# Patient Record
Sex: Female | Born: 1935 | Race: White | Hispanic: No | Smoking: Never smoker
Health system: Southern US, Community
[De-identification: ages and names within clinical notes are randomized; demographics above are authoritative.]

## PROBLEM LIST (undated history)

## (undated) DIAGNOSIS — M1990 Unspecified osteoarthritis, unspecified site: Secondary | ICD-10-CM

## (undated) DIAGNOSIS — I4891 Unspecified atrial fibrillation: Principal | ICD-10-CM

## (undated) DIAGNOSIS — G629 Polyneuropathy, unspecified: Secondary | ICD-10-CM

## (undated) DIAGNOSIS — F3110 Bipolar disorder, current episode manic without psychotic features, unspecified: Secondary | ICD-10-CM

## (undated) DIAGNOSIS — D649 Anemia, unspecified: Secondary | ICD-10-CM

## (undated) DIAGNOSIS — F0280 Dementia in other diseases classified elsewhere without behavioral disturbance: Secondary | ICD-10-CM

## (undated) DIAGNOSIS — K219 Gastro-esophageal reflux disease without esophagitis: Secondary | ICD-10-CM

## (undated) DIAGNOSIS — Z87440 Personal history of urinary (tract) infections: Secondary | ICD-10-CM

## (undated) DIAGNOSIS — C4491 Basal cell carcinoma of skin, unspecified: Secondary | ICD-10-CM

## (undated) DIAGNOSIS — G309 Alzheimer's disease, unspecified: Secondary | ICD-10-CM

## (undated) HISTORY — DX: Alzheimer's disease, unspecified: G30.9

## (undated) HISTORY — DX: Gastro-esophageal reflux disease without esophagitis: K21.9

## (undated) HISTORY — DX: Personal history of urinary (tract) infections: Z87.440

## (undated) HISTORY — PX: LUMBAR LAMINECTOMY: SHX95

## (undated) HISTORY — PX: CHOLECYSTECTOMY: SHX55

## (undated) HISTORY — PX: OTHER SURGICAL HISTORY: SHX169

## (undated) HISTORY — DX: Anemia, unspecified: D64.9

## (undated) HISTORY — DX: Polyneuropathy, unspecified: G62.9

## (undated) HISTORY — DX: Unspecified atrial fibrillation: I48.91

## (undated) HISTORY — DX: Bipolar disorder, current episode manic without psychotic features, unspecified: F31.10

## (undated) HISTORY — DX: Dementia in other diseases classified elsewhere, unspecified severity, without behavioral disturbance, psychotic disturbance, mood disturbance, and anxiety: F02.80

## (undated) HISTORY — DX: Basal cell carcinoma of skin, unspecified: C44.91

## (undated) HISTORY — DX: Unspecified osteoarthritis, unspecified site: M19.90

---

## 2007-09-02 ENCOUNTER — Ambulatory Visit: Payer: Self-pay | Admitting: Internal Medicine

## 2007-09-02 HISTORY — PX: OTHER SURGICAL HISTORY: SHX169

## 2007-09-14 ENCOUNTER — Ambulatory Visit: Payer: Self-pay | Admitting: Internal Medicine

## 2007-09-16 ENCOUNTER — Observation Stay: Payer: Self-pay | Admitting: Internal Medicine

## 2007-10-03 ENCOUNTER — Ambulatory Visit: Payer: Self-pay | Admitting: Internal Medicine

## 2007-11-02 ENCOUNTER — Ambulatory Visit: Payer: Self-pay | Admitting: Internal Medicine

## 2007-12-03 ENCOUNTER — Ambulatory Visit: Payer: Self-pay | Admitting: Internal Medicine

## 2008-01-02 ENCOUNTER — Ambulatory Visit: Payer: Self-pay | Admitting: Internal Medicine

## 2008-02-02 ENCOUNTER — Ambulatory Visit: Payer: Self-pay | Admitting: Internal Medicine

## 2008-02-06 ENCOUNTER — Encounter: Admission: RE | Admit: 2008-02-06 | Discharge: 2008-02-06 | Payer: Self-pay | Admitting: Neurology

## 2008-02-21 ENCOUNTER — Encounter: Payer: Self-pay | Admitting: Internal Medicine

## 2008-02-22 ENCOUNTER — Encounter: Payer: Self-pay | Admitting: Internal Medicine

## 2008-02-27 ENCOUNTER — Encounter: Payer: Self-pay | Admitting: Internal Medicine

## 2008-02-27 ENCOUNTER — Ambulatory Visit: Payer: Self-pay | Admitting: Internal Medicine

## 2008-02-27 DIAGNOSIS — D509 Iron deficiency anemia, unspecified: Secondary | ICD-10-CM

## 2008-02-27 DIAGNOSIS — G309 Alzheimer's disease, unspecified: Secondary | ICD-10-CM

## 2008-02-27 DIAGNOSIS — F0280 Dementia in other diseases classified elsewhere without behavioral disturbance: Secondary | ICD-10-CM | POA: Insufficient documentation

## 2008-02-27 DIAGNOSIS — G589 Mononeuropathy, unspecified: Secondary | ICD-10-CM | POA: Insufficient documentation

## 2008-02-27 DIAGNOSIS — F319 Bipolar disorder, unspecified: Secondary | ICD-10-CM

## 2008-03-04 ENCOUNTER — Ambulatory Visit: Payer: Self-pay | Admitting: Internal Medicine

## 2008-03-21 ENCOUNTER — Telehealth: Payer: Self-pay | Admitting: Internal Medicine

## 2008-04-01 ENCOUNTER — Ambulatory Visit: Payer: Self-pay | Admitting: Internal Medicine

## 2008-04-19 ENCOUNTER — Telehealth: Payer: Self-pay | Admitting: Internal Medicine

## 2008-04-24 ENCOUNTER — Telehealth: Payer: Self-pay | Admitting: Internal Medicine

## 2008-04-25 ENCOUNTER — Telehealth: Payer: Self-pay | Admitting: Internal Medicine

## 2008-04-25 ENCOUNTER — Encounter: Payer: Self-pay | Admitting: Internal Medicine

## 2008-05-02 ENCOUNTER — Telehealth: Payer: Self-pay | Admitting: Internal Medicine

## 2008-05-02 ENCOUNTER — Ambulatory Visit: Payer: Self-pay | Admitting: Internal Medicine

## 2008-05-10 ENCOUNTER — Telehealth: Payer: Self-pay | Admitting: Internal Medicine

## 2008-05-16 ENCOUNTER — Encounter: Payer: Self-pay | Admitting: Internal Medicine

## 2008-05-23 ENCOUNTER — Ambulatory Visit: Payer: Self-pay | Admitting: Internal Medicine

## 2008-05-23 ENCOUNTER — Encounter: Payer: Self-pay | Admitting: Internal Medicine

## 2008-05-23 DIAGNOSIS — M1990 Unspecified osteoarthritis, unspecified site: Secondary | ICD-10-CM | POA: Insufficient documentation

## 2008-06-12 ENCOUNTER — Encounter: Payer: Self-pay | Admitting: Internal Medicine

## 2008-07-06 ENCOUNTER — Emergency Department: Payer: Self-pay | Admitting: Emergency Medicine

## 2008-07-06 ENCOUNTER — Telehealth: Payer: Self-pay | Admitting: Internal Medicine

## 2008-07-06 ENCOUNTER — Encounter: Payer: Self-pay | Admitting: Internal Medicine

## 2008-07-08 ENCOUNTER — Telehealth: Payer: Self-pay | Admitting: Internal Medicine

## 2008-07-17 ENCOUNTER — Telehealth: Payer: Self-pay | Admitting: Internal Medicine

## 2008-07-25 ENCOUNTER — Encounter: Payer: Self-pay | Admitting: Internal Medicine

## 2008-07-25 ENCOUNTER — Ambulatory Visit: Payer: Self-pay | Admitting: Internal Medicine

## 2008-08-16 ENCOUNTER — Encounter: Payer: Self-pay | Admitting: Internal Medicine

## 2008-08-21 ENCOUNTER — Encounter: Payer: Self-pay | Admitting: Internal Medicine

## 2008-08-26 ENCOUNTER — Telehealth: Payer: Self-pay | Admitting: Internal Medicine

## 2008-08-28 ENCOUNTER — Telehealth: Payer: Self-pay | Admitting: Internal Medicine

## 2008-09-10 ENCOUNTER — Telehealth: Payer: Self-pay | Admitting: Internal Medicine

## 2008-09-11 ENCOUNTER — Encounter: Payer: Self-pay | Admitting: Internal Medicine

## 2008-09-19 ENCOUNTER — Encounter: Payer: Self-pay | Admitting: Internal Medicine

## 2008-10-17 ENCOUNTER — Telehealth: Payer: Self-pay | Admitting: Internal Medicine

## 2008-10-22 ENCOUNTER — Encounter: Payer: Self-pay | Admitting: Internal Medicine

## 2008-11-21 ENCOUNTER — Encounter: Payer: Self-pay | Admitting: Internal Medicine

## 2008-11-21 ENCOUNTER — Ambulatory Visit: Payer: Self-pay | Admitting: Internal Medicine

## 2008-12-10 ENCOUNTER — Encounter: Payer: Self-pay | Admitting: Internal Medicine

## 2008-12-18 ENCOUNTER — Encounter: Payer: Self-pay | Admitting: Internal Medicine

## 2008-12-18 ENCOUNTER — Telehealth: Payer: Self-pay | Admitting: Internal Medicine

## 2009-01-22 ENCOUNTER — Encounter: Payer: Self-pay | Admitting: Internal Medicine

## 2009-02-24 ENCOUNTER — Telehealth: Payer: Self-pay | Admitting: Internal Medicine

## 2009-03-17 ENCOUNTER — Telehealth: Payer: Self-pay | Admitting: Internal Medicine

## 2009-03-19 ENCOUNTER — Encounter: Payer: Self-pay | Admitting: Internal Medicine

## 2009-04-08 ENCOUNTER — Telehealth: Payer: Self-pay | Admitting: Internal Medicine

## 2009-04-10 ENCOUNTER — Encounter: Payer: Self-pay | Admitting: Internal Medicine

## 2009-04-23 ENCOUNTER — Encounter: Payer: Self-pay | Admitting: Internal Medicine

## 2009-05-01 ENCOUNTER — Telehealth: Payer: Self-pay | Admitting: Internal Medicine

## 2009-05-08 ENCOUNTER — Encounter: Payer: Self-pay | Admitting: Internal Medicine

## 2009-05-08 ENCOUNTER — Ambulatory Visit: Payer: Self-pay | Admitting: Internal Medicine

## 2009-05-30 ENCOUNTER — Telehealth: Payer: Self-pay | Admitting: Internal Medicine

## 2009-05-30 ENCOUNTER — Emergency Department: Payer: Self-pay | Admitting: Emergency Medicine

## 2009-07-03 ENCOUNTER — Encounter: Payer: Self-pay | Admitting: Family Medicine

## 2009-08-14 ENCOUNTER — Encounter: Payer: Self-pay | Admitting: Family Medicine

## 2009-08-14 ENCOUNTER — Telehealth: Payer: Self-pay | Admitting: Family Medicine

## 2009-08-14 ENCOUNTER — Encounter: Payer: Self-pay | Admitting: Internal Medicine

## 2009-08-18 ENCOUNTER — Telehealth: Payer: Self-pay | Admitting: Internal Medicine

## 2009-08-22 ENCOUNTER — Ambulatory Visit: Payer: Self-pay | Admitting: Internal Medicine

## 2009-08-22 DIAGNOSIS — G20 Parkinson's disease: Secondary | ICD-10-CM

## 2009-08-23 ENCOUNTER — Encounter: Payer: Self-pay | Admitting: Internal Medicine

## 2009-08-23 LAB — CONVERTED CEMR LAB: Lithium Lvl: 0.9 meq/L (ref 0.80–1.40)

## 2009-08-25 LAB — CONVERTED CEMR LAB
AST: 18 units/L (ref 0–37)
Albumin: 3.9 g/dL (ref 3.5–5.2)
BUN: 19 mg/dL (ref 6–23)
Basophils Absolute: 0 10*3/uL (ref 0.0–0.1)
CO2: 28 meq/L (ref 19–32)
Calcium: 9.8 mg/dL (ref 8.4–10.5)
Chloride: 109 meq/L (ref 96–112)
Creatinine, Ser: 1.2 mg/dL (ref 0.4–1.2)
Eosinophils Absolute: 0.2 10*3/uL (ref 0.0–0.7)
HCT: 31.1 % — ABNORMAL LOW (ref 36.0–46.0)
Hemoglobin: 10.6 g/dL — ABNORMAL LOW (ref 12.0–15.0)
Lymphs Abs: 0.7 10*3/uL (ref 0.7–4.0)
MCHC: 34.2 g/dL (ref 30.0–36.0)
MCV: 94.4 fL (ref 78.0–100.0)
Monocytes Absolute: 0.4 10*3/uL (ref 0.1–1.0)
Monocytes Relative: 6.4 % (ref 3.0–12.0)
Neutro Abs: 4.6 10*3/uL (ref 1.4–7.7)
Platelets: 159 10*3/uL (ref 150.0–400.0)
RDW: 13.9 % (ref 11.5–14.6)
TSH: 0.94 microintl units/mL (ref 0.35–5.50)
Total Bilirubin: 0.5 mg/dL (ref 0.3–1.2)

## 2009-09-01 ENCOUNTER — Encounter: Payer: Self-pay | Admitting: Family Medicine

## 2009-09-02 ENCOUNTER — Ambulatory Visit: Payer: Self-pay | Admitting: Family Medicine

## 2009-09-08 ENCOUNTER — Encounter: Payer: Self-pay | Admitting: Internal Medicine

## 2009-10-03 ENCOUNTER — Encounter: Payer: Self-pay | Admitting: Internal Medicine

## 2009-11-06 ENCOUNTER — Encounter: Payer: Self-pay | Admitting: Internal Medicine

## 2009-11-06 ENCOUNTER — Ambulatory Visit: Payer: Self-pay | Admitting: Internal Medicine

## 2010-02-23 ENCOUNTER — Telehealth: Payer: Self-pay | Admitting: Internal Medicine

## 2010-02-23 ENCOUNTER — Encounter: Payer: Self-pay | Admitting: Internal Medicine

## 2010-02-25 ENCOUNTER — Encounter: Payer: Self-pay | Admitting: Internal Medicine

## 2010-02-26 ENCOUNTER — Encounter: Payer: Self-pay | Admitting: Internal Medicine

## 2010-02-27 ENCOUNTER — Encounter: Payer: Self-pay | Admitting: Internal Medicine

## 2010-03-03 NOTE — Progress Notes (Signed)
Summary: galantamine  Phone Note Refill Request Message from:  Fax from Pharmacy on February 24, 2009 2:19 PM  Refills Requested: Medication #1:  GALANTAMINE HYDROBROMIDE 8 MG XR24H-CAP 1 daily.   Supply Requested: 3 months pharmacare services [phone] fax [phone]   Method Requested: Fax to Local Pharmacy Initial call taken by: Benny Lennert CMA Duncan Dull),  February 24, 2009 2:20 PM  Follow-up for Phone Call        okay to refill for 1 year Follow-up by: Cindee Salt MD,  February 24, 2009 3:31 PM  Additional Follow-up for Phone Call Additional follow up Details #1::        Rx faxed to pharmacy Additional Follow-up by: DeShannon Smith CMA Duncan Dull),  February 24, 2009 3:52 PM    Prescriptions: GALANTAMINE HYDROBROMIDE 8 MG XR24H-CAP (GALANTAMINE HYDROBROMIDE) 1 daily  #30 x 12   Entered by:   Mervin Hack CMA (AAMA)   Authorized by:   Cindee Salt MD   Signed by:   Mervin Hack CMA (AAMA) on 02/24/2009   Method used:   Faxed to ...       [facility] (mail-order)       [address]       [place], [place]  [address]       Ph: [phone]       Fax: [phone]   RxID:   [id number]

## 2010-03-03 NOTE — Assessment & Plan Note (Signed)
Summary: [facility]   Vital Signs:  Patient profile:   77 year old female Weight:      135 pounds Pulse rate:   60 / minute Resp:     18 per minute BP sitting:   142 / 50 CC: Assisted living follow up   History of Present Illness: Seen with daughter Michele Mendoza Reviewed status with Michele Mendoza the clinical care coordinator  No new concerns from the staff  has been on axona for about 2 weeks daughter had been concerned about some arm and hand bruising They have healed now and no recurrence Initially concern about the axona--but doesn't seem to be related Daughter feels she may be slightly better--staff not aware of any cognitive changes (she recognized daughter's car, remembered that daughter had some arm problems)  Now on locked unit upstairs she has adjusted well gentleman friend died---she doesn't realize this and doesn't seem to miss him  Hair loss has stopped Now doing better with this  Mood has been good No apparent depression  NO dysuria or change in urinary habits  Allergies: 1)  ! Sulfamethoxazole-Tmp Ds (Sulfamethoxazole-Trimethoprim) 2)  ! Codeine Sulfate (Codeine Sulfate) 3)  Depakote (Divalproex Sodium) 4)  Penicillin V Potassium (Penicillin V Potassium) 5)  Ciprofloxacin Hcl (Ciprofloxacin Hcl) 6)  Epinephrine Hcl (Epinephrine Hcl) 7)  Prednisone (Prednisone)  Past History:  Past medical, surgical, family and social histories (including risk factors) reviewed for relevance to current acute and chronic problems.  Past Medical History: Reviewed history from 05/23/2008 and no changes required. Anemia-iron deficiency Altzheimers Bipolar disorder---mania Neuropathy Basal cell carcinomas Recurrent UTIs Osteoarthritis  Past Surgical History: Reviewed history from 02/27/2008 and no changes required. Anemia-8/09    Transfused, procrit once----------------Dr Gittin Hospitalized twice for mania Right leg plate from  ZOXWRUEA--5409'W Cholecystectomy--1990's Lumbar laminectomy--1960's  Family History: Reviewed history from 02/27/2008 and no changes required. Daughter with CAD Mom died of Altzheimers--died in late 1's Dad died of possible stomach cancer 10 sibs  Social History: Reviewed history from 02/27/2008 and no changes required. Widowed June 2009 3 daughters, 1 son Retired---[employer] Never Smoked Alcohol use-no  Has living will. Michele Mendoza and [place] hold health care POA Discussed DNR--Michele Mendoza feels she would want this  Review of Systems       sleeps well occ dry mouth appetite is fine weight fairly stable Recent ENT eval---   L>R hearing loss. Considering amplification  Physical Exam  General:  alert and normal appearance.   Neck:  supple, no masses, no thyromegaly, no carotid bruits, and no cervical lymphadenopathy.   Lungs:  normal respiratory effort and normal breath sounds.   Heart:  normal rate, regular rhythm, no murmur, and no gallop.   Abdomen:  soft and non-tender.   Msk:  no joint tenderness and no joint swelling.   Extremities:  no edema Neurologic:  alert  Fairly quiet but responds appropriately to questions Normal tone no focal weakness Psych:  not anxious appearing and not depressed appearing.     Impression & Recommendations:  Problem # 1:  ALZHEIMERS DISEASE (ICD-331.0) Assessment Unchanged may be slightly better cognitively on the axona--though hard to tell will continue the galantamine and namenda  as well  Problem # 2:  BIPOLAR DISORDER UNSPECIFIED (ICD-296.80) Assessment: Unchanged stable on zyprexa failed wean in the past so no change  Problem # 3:  OSTEOARTHRITIS (ICD-715.90) Assessment: Unchanged no major pain issues of late  Problem # 4:  NEUROPATHY (ICD-355.9) Assessment: Comment Only no apparent pain or  sensory problems currently  Complete Medication List: 1)  Docusate Sodium 100 Mg Caps (Docusate sodium) .... Take 2 capsules by  mouth once daily for stool softener ( take with glass of water) 2)  Vitamin D 400 Unit Tabs (Cholecalciferol) .... Take 2 tablets by mouth every morning 3)  Namenda 10 Mg Tabs (Memantine hcl) .... Take 1 tablet by mouth two times a day at 8am and 6pm (alzheimers) 4)  Folic Acid 1 Mg Tabs (Folic acid) .... Take 1 tablet by mouth once daily at 2pm 5)  Lithium Carbonate 300 Mg Tabs (Lithium carbonate) .... Take 1 tablet by mouth every evening 6)  Tandem 162-115.2 Mg Caps (Ferrous fum-iron polysacch) .... Take 1 capsule by mouth once daily at 2:00pm 7)  Zyprexa 5 Mg Tabs (Olanzapine) .... Take 1/2 tablet by mouth two times a day 8)  Genteal Gel (Artificial tear) .... Apply 1 drop in each eye three times a day 9)  Galantamine Hydrobromide 8 Mg Xr24h-cap (Galantamine hydrobromide) .Marland Kitchen.. 1 daily 10)  Axona Pack (Dietary management product) .Marland Kitchen.. 1 pack daily as directed  Patient Instructions: 1)  Will plan follow up in about 4 months

## 2010-03-03 NOTE — Miscellaneous (Signed)
Summary: Care Plan/[facility]  Care Plan/[facility]   Imported By: Lanelle Bal 09/08/2009 08:44:54  _____________________________________________________________________  External Attachment:    Type:   Image     Comment:   External Document

## 2010-03-03 NOTE — Miscellaneous (Signed)
Summary: Standing & Med Orders/[facility]  Standing & Med Orders/[facility]   Imported By: Lanelle Bal 03/21/2009 12:35:13  _____________________________________________________________________  External Attachment:    Type:   Image     Comment:   External Document

## 2010-03-03 NOTE — Miscellaneous (Signed)
Summary: Care Plan/[facility]  Care Plan/[facility]   Imported By: Lanelle Bal 09/08/2009 08:08:54  _____________________________________________________________________  External Attachment:    Type:   Image     Comment:   External Document

## 2010-03-03 NOTE — Miscellaneous (Signed)
Summary: Ocie Bob PT & UA Orders/[facility] Gwenlyn Fudge [facility] & [facility] Orders/[facility]   Imported By: Lanelle Bal 08/19/2009 12:24:11  _____________________________________________________________________  External Attachment:    Type:   Image     Comment:   External Document

## 2010-03-03 NOTE — Miscellaneous (Signed)
Summary: Order for [employer]  Order for [facility]   Imported By: Lanelle Bal 10/13/2009 08:10:45  _____________________________________________________________________  External Attachment:    Type:   Image     Comment:   External Document

## 2010-03-03 NOTE — Miscellaneous (Signed)
Summary: Standing Orders & Care Plan/[facility]  Standing Orders & Care Plan/[facility]   Imported By: Lanelle Bal 09/29/2009 08:43:56  _____________________________________________________________________  External Attachment:    Type:   Image     Comment:   External Document

## 2010-03-03 NOTE — Miscellaneous (Signed)
Summary: Face to Face Encounter Form/[facility]  Face to Face Encounter Form/[facility]   Imported By: Lanelle Bal 08/27/2009 11:57:56  _____________________________________________________________________  External Attachment:    Type:   Image     Comment:   External Document

## 2010-03-03 NOTE — Miscellaneous (Signed)
Summary: UTI Sample [facility]  UTI Sample Order/[facility]   Imported By: Lanelle Bal 07/09/2009 09:03:25  _____________________________________________________________________  External Attachment:    Type:   Image     Comment:   External Document

## 2010-03-03 NOTE — Progress Notes (Signed)
Summary: uti  Phone Note Call from Patient Call back at [phone]   Caller: Nona Dell  Call For: Cindee Salt MD Complaint: Breathing Problems Summary of Call: Daugther says that patient is having right flank pain, having hard time standing, and feeling very weak. She says that her mother's urine smells horrible.  Diamantina Monks sent an order yesterday for a urinalysis  and they haven't heard anything back. Daughter is upset because she has been having these symptoms for 3 days or more and she gets utis very often and she wants it to be treated. She is asking if you could just go ahead and treat her for this.  Daughter also would like to request physical therapy evaluation because it is getting really hard for her to get around and daughter is worried that she will fall. Daughter also does not want her to take the axona anymore because she doesn't feel that it is doing anything.  Initial call taken by: Melody Comas,  August 14, 2009 10:08 AM  Follow-up for Phone Call        McLean, can you please look into this for me? Thanks. Ruthe Mannan MD  August 14, 2009 10:48 AM  found the order in Dr.Letvak's inbox, it was faxed on Tuesday. I sent a flag to asking no one to put or send anything to Dr.Letvak. Aram Beecham stated I should be checking his inbox, and I will now. I faxed order back over to [facility] and spoke with [name] and [name] and apoligized for now seeing the order. Did you want to give the order for physical therapy? Please advise. DeShannon Smith CMA Duncan Dull)  August 14, 2009 11:52 AM    Additional Follow-up for Phone Call Additional follow up Details #1::        yes, I can give the order for PT as well. Ruthe Mannan MD  August 14, 2009 11:55 AM  order faxed to [facility] for physical therapy. DeShannon Katrinka Blazing CMA Duncan Dull)  August 14, 2009 12:19 PM   spoke with daughter and advised that urine will be done and that order for PT has been faxed. Daughter requested that this be done STAT before the  weekend so that her mother will be treated. I then called [name] and she's getting a urine from the pt and take it over to [facility] for UA and they will fax results to Dr. Dayton Martes. DeShannon Smith CMA Duncan Dull)  August 14, 2009 12:40 PM     Additional Follow-up for Phone Call Additional follow up Details #2::    ok thank you. Ruthe Mannan MD  August 14, 2009 12:42 PM

## 2010-03-03 NOTE — Progress Notes (Signed)
Summary: Rx Zyprexa  Phone Note Refill Request Call back at [phone] Message from:  [facility] on March 17, 2009 1:25 PM  Refills Requested: Medication #1:  ZYPREXA 5 MG TABS take 1/2 tablet by mouth two times a day   Last Refilled: 02/11/2009 Received faxed refill request, form in your IN box.  Please advise   Method Requested: Fax to Local Pharmacy Initial call taken by: Linde Gillis CMA Duncan Dull),  March 17, 2009 1:26 PM  Follow-up for Phone Call        Rx completed in Dr. Tiajuana Amass Follow-up by: Cindee Salt MD,  March 17, 2009 1:28 PM    Prescriptions: ZYPREXA 5 MG TABS (OLANZAPINE) take 1/2 tablet by mouth two times a day  #30 x 11   Entered and Authorized by:   Cindee Salt MD   Signed by:   Cindee Salt MD on 03/17/2009   Method used:   Faxed to ...       [facility] (mail-order)       [address]       [place], [place]  [address]       Ph: [phone]       Fax: [phone]   RxID:   [id number]

## 2010-03-03 NOTE — Assessment & Plan Note (Signed)
Summary: 8:00  FILL OUT FORM FOR [facility]   Vital Signs:  Patient profile:   77 year old female Weight:      138.50 pounds Temp:     98.2 degrees F oral Pulse rate:   76 / minute Pulse rhythm:   regular BP sitting:   138 / 70  (left arm) Cuff size:   regular  Vitals Entered By: Janee Morn CMA (August 22, 2009 8:26 AM) CC: Fill out form   History of Present Illness: Has seen Dr Sandria Manly in past for her dementia and neuro syndrome last seen about 1 year ago CT head done without sig findings Peripheral neuropathy diagnosed  Has had 2 recent falls Unable to get up out of chair as well Had to call rescue 3 weeks ago due to bruising and swelling of foot after call Fall again 1 week ago Doesn't remember to use walker Legs get weak--but also lists off to side  Increased tremor--esp if cold SHoulders twitch--even when asleep  May have some degree of orthostasis daughter has checked  Allergies: 1)  ! Sulfamethoxazole-Tmp Ds (Sulfamethoxazole-Trimethoprim) 2)  ! Codeine Sulfate (Codeine Sulfate) 3)  Depakote (Divalproex Sodium) 4)  Penicillin V Potassium (Penicillin V Potassium) 5)  Ciprofloxacin Hcl (Ciprofloxacin Hcl) 6)  Epinephrine Hcl (Epinephrine Hcl) 7)  Prednisone (Prednisone)  Past History:  Past medical, surgical, family and social histories (including risk factors) reviewed for relevance to current acute and chronic problems.  Past Medical History: Reviewed history from 05/23/2008 and no changes required. Anemia-iron deficiency Altzheimers Bipolar disorder---mania Neuropathy Basal cell carcinomas Recurrent UTIs Osteoarthritis  Past Surgical History: Reviewed history from 02/27/2008 and no changes required. Anemia-8/09    Transfused, procrit once----------------Dr Gittin Hospitalized twice for mania Right leg plate from ZOXWRUEA--5409'W Cholecystectomy--1990's Lumbar laminectomy--1960's  Family History: Reviewed history from 02/27/2008 and no changes  required. Daughter with CAD Mom died of Altzheimers--died in late 59's Dad died of possible stomach cancer 10 sibs  Social History: Reviewed history from 02/27/2008 and no changes required. Widowed June 2009 3 daughters, 1 son Retired---[employer] Never Smoked Alcohol use-no  Has living will. [name] and [place] hold health care POA Discussed DNR--[name] feels she would want this  Review of Systems       appetite is down Stopped axona in case it is affecting her appetite ------ daughter feels she had "done well with it" sleeping okay--more recently  Physical Exam  General:  alert.  NAD Neck:  supple and no masses.   Neurologic:  No focal weakness moderate resting tremor some overshoot with finger to nose Early shuffling gait No increased tone but moderate bradykinesia Psych:  flat affect and subdued.   Does follow commands fairly well   Impression & Recommendations:  Problem # 1:  PARKINSONISM (ICD-332.0) Assessment New has had mild features of this in past but now much worse gait disturbance and falls seems to be part of her dementia syndrome  will proceed with PT May benefit from OT since her functional status has declined---dementia may preclude response though  Problem # 2:  BIPOLAR DISORDER UNSPECIFIED (ICD-296.80) Assessment: Comment Only stable affective status will check labs  Orders: Venipuncture (11914) TLB-Renal Function Panel (80069-RENAL) TLB-CBC Platelet - w/Differential (85025-CBCD) TLB-Hepatic/Liver Function Pnl (80076-HEPATIC) TLB-TSH (Thyroid Stimulating Hormone) (84443-TSH) T-Lithium Level ([address]-62130) Specimen Handling (86578)  Complete Medication List: 1)  Docusate Sodium 100 Mg Caps (Docusate sodium) .... Take 2 capsules by mouth once daily for stool softener ( take with glass of water) 2)  Vitamin D 400 Unit Tabs (Cholecalciferol) .... Take 2 tablets by mouth every morning 3)  Namenda 10 Mg Tabs (Memantine hcl) .... Take 1  tablet by mouth two times a day at 8am and 6pm (alzheimers) 4)  Folic Acid 1 Mg Tabs (Folic acid) .... Take 1 tablet by mouth once daily at 2pm 5)  Lithium Carbonate 300 Mg Tabs (Lithium carbonate) .... Take 1 tablet by mouth every evening 6)  Tandem 162-115.2 Mg Caps (Ferrous fum-iron polysacch) .... Take 1 capsule by mouth once daily at 2:00pm 7)  Zyprexa 5 Mg Tabs (Olanzapine) .... Take 1/2 tablet by mouth two times a day 8)  Genteal Gel (Artificial tear) .... Apply 1 drop in each eye three times a day 9)  Galantamine Hydrobromide 8 Mg Xr24h-cap (Galantamine hydrobromide) .Marland Kitchen.. 1 daily  Patient Instructions: 1)  Will do follow up at [facility]  Current Allergies (reviewed today): ! SULFAMETHOXAZOLE-TMP DS (SULFAMETHOXAZOLE-TRIMETHOPRIM) ! CODEINE SULFATE (CODEINE SULFATE) DEPAKOTE (DIVALPROEX SODIUM) PENICILLIN V POTASSIUM (PENICILLIN V POTASSIUM) CIPROFLOXACIN HCL (CIPROFLOXACIN HCL) EPINEPHRINE HCL (EPINEPHRINE HCL) PREDNISONE (PREDNISONE)  Appended Document: 8:00  FILL OUT FORM FOR [facility]    Clinical Lists Changes  Orders: Added new Service order of Specimen Handling ([address]109) - Signed

## 2010-03-03 NOTE — Assessment & Plan Note (Signed)
Summary[facility]   Vital Signs:  Patient profile:   77 year old female Weight:      141 pounds BMI:     22.84 Temp:     97.0 degrees F Pulse rate:   72 / minute Resp:     16 per minute BP sitting:   134 / 86 CC: Assisted living follow up   History of Present Illness: Daughter Michele Mendoza is here reveiwed status with Michele Mendoza--the clinical coordinator here as well  Now on the second floor --the secure area Has adjusted well Likes her room diaghter is satisfied  Still with some trouble walking Has rollator but generally won't use it No recent leaning spells  No recent falls---- a couple since my last visit Did require ER eval a couple of months ago but only had hip bruising Some trouble getting out of a chair now---does use lift chair  Mood has been fine No sig depression  No mania  Hard to judge neuropathy she doesn't complain of pain some balance issues  Has been less socially interactive lately--increasingly withdrawn Sleeps more during the day  Allergies: 1)  ! Sulfamethoxazole-Tmp Ds (Sulfamethoxazole-Trimethoprim) 2)  ! Codeine Sulfate (Codeine Sulfate) 3)  Depakote (Divalproex Sodium) 4)  Penicillin V Potassium (Penicillin V Potassium) 5)  Ciprofloxacin Hcl (Ciprofloxacin Hcl) 6)  Epinephrine Hcl (Epinephrine Hcl) 7)  Prednisone (Prednisone)  Past History:  Past medical, surgical, family and social histories (including risk factors) reviewed for relevance to current acute and chronic problems.  Past Medical History: Reviewed history from 05/23/2008 and no changes required. Anemia-iron deficiency Altzheimers Bipolar disorder---mania Neuropathy Basal cell carcinomas Recurrent UTIs Osteoarthritis  Past Surgical History: Reviewed history from 02/27/2008 and no changes required. Anemia-8/09    Transfused, procrit once----------------Dr Gittin Hospitalized twice for mania Right leg plate from  [id number]--9562'Z Cholecystectomy--1990's Lumbar laminectomy--1960's  Family History: Reviewed history from 02/27/2008 and no changes required. Daughter with CAD Mom died of Altzheimers--died in late 28's Dad died of possible stomach cancer 10 sibs  Social History: Reviewed history from 02/27/2008 and no changes required. Widowed June 2009 3 daughters, 1 son Retired---[employer] Never Smoked Alcohol use-no  Has living will. Michele Mendoza and Michele Mendoza hold health care POA Discussed DNR--Michele Mendoza feels she would want this  Review of Systems  The patient denies syncope, dyspnea on exertion, and peripheral edema.         Eating better since off axonna weight up a few pounds  bowels okay   Physical Exam  General:  alert and normal appearance.   Neck:  supple, no masses, no thyromegaly, and no cervical lymphadenopathy.   Lungs:  normal respiratory effort, no intercostal retractions, no accessory muscle use, and normal breath sounds.   Heart:  normal rate, regular rhythm, no murmur, and no gallop.   Abdomen:  soft, non-tender, and no masses.   Msk:  no joint tenderness and no joint swelling.   Extremities:  no sig edema Neurologic:  Walks with small steps but not shuffling Mild resting tremor in hands (L>R) Mild increased tone and bradykinesia Psych:  not anxious appearing and not depressed appearing.     Impression & Recommendations:  Problem # 1:  ALZHEIMERS DISEASE (ICD-331.0) Assessment Unchanged mild to moderate but stable diminshing functional and cognitive status but only slow still able to walk  Problem # 2:  PARKINSONISM (ICD-332.0) Assessment: Unchanged stable features seems to be part of the dementia problem  Problem # 3:  BIPOLAR DISORDER UNSPECIFIED (ICD-296.80) Assessment: Unchanged  stable No mania will try stopping the AM zyprexa since she has been more sleepy and status has been stable  Problem # 4:  NEUROPATHY (ICD-355.9) Assessment: Comment  Only probably affecting her walking but no sig pain issues  Problem # 5:  OSTEOARTHRITIS (ICD-715.90) Assessment: Unchanged no sig pain issues does have some stiffness  Complete Medication List: 1)  Docusate Sodium 100 Mg Caps (Docusate sodium) .... Take 2 capsules by mouth once daily for stool softener ( take with glass of water) 2)  Vitamin D 400 Unit Tabs (Cholecalciferol) .... Take 2 tablets by mouth every morning 3)  Namenda 10 Mg Tabs (Memantine hcl) .... Take 1 tablet by mouth two times a day at 8am and 6pm (alzheimers) 4)  Folic Acid 1 Mg Tabs (Folic acid) .... Take 1 tablet by mouth once daily at 2pm 5)  Lithium Carbonate 300 Mg Tabs (Lithium carbonate) .... Take 1 tablet by mouth every evening 6)  Tandem 162-115.2 Mg Caps (Ferrous fum-iron polysacch) .... Take 1 capsule by mouth once daily at 2:00pm 7)  Zyprexa 5 Mg Tabs (Olanzapine) .... Take 1/2 tablet by mouth in evening 8)  Genteal Gel (Artificial tear) .... Apply 1 drop in each eye three times a day 9)  Galantamine Hydrobromide 8 Mg Xr24h-cap (Galantamine hydrobromide) .Marland Kitchen.. 1 daily  Patient Instructions: 1)  Will plan follow up in about 4 months

## 2010-03-03 NOTE — Progress Notes (Signed)
Summary: dark spots under skin  Phone Note Call from Patient   Caller: Daughter- [name]  [phone] Summary of Call: Daughter states pt has been getting bruises that look like blood blisters under her skin for the past 2 weeks, since starting axona. She gets her folic acid and iron about an hour after she drinks the shake.  Should she change the time that she takes the supplements? Is axona blocking the absorption of the iron?   She has a hx of anemia.  Daughter states pt has not had this problem before.  Please advise. Initial call taken by: Lowella Petties CMA,  May 01, 2009 11:01 AM  Follow-up for Phone Call        More likely that the axona is affecting platelet function somehow and she is bleeding easier I hope to go out to see her next Thursday and I can evaluate---I would recommend no change until then Follow-up by: Cindee Salt MD,  May 01, 2009 11:32 AM  Additional Follow-up for Phone Call Additional follow up Details #1::        Advised pt's daughter.                Lowella Petties CMA  May 01, 2009 1:40 PM

## 2010-03-03 NOTE — Letter (Signed)
Summary: [facility]  [facility]   Imported By: Maryln Gottron 04/29/2009 09:58:29  _____________________________________________________________________  External Attachment:    Type:   Image     Comment:   External Document

## 2010-03-03 NOTE — Progress Notes (Signed)
Summary: not feeling well  Phone Note From Other Clinic   Caller: Larrelle at [facility] [phone] Summary of Call: Med tech states pt is not feeling well- she has clear nasal drainage, pt is leaning to the right- which is what she does when she's sick, according to her daughter, temp of 101.2.  Family is requesting abx and decongestant. No cough, no shortness of breath.  Fax order to [employer] [facility] - [phone].  Lowella Petties CMA  May 30, 2009 12:56 PM  Called tech back to get more information.  She said that something seemed to be wrong with pt's leg. I spoke to pt's daughter while the tech was trying to stand the pt up.  Pt unable to stand, daughter says they may call EMS.  I asked her to let us know what happens. Initial call taken by: Lowella Petties CMA,  May 30, 2009 12:59 PM  Follow-up for Phone Call        can offer appt here but I will not treat over the phone If really unstable, call EMS Cindee Salt MD  May 30, 2009 1:02 PM   Chaya Jan, she said pt is going to ER.        Lowella Petties CMA  May 30, 2009 1:13 PM  Please check on her Monday AM  Follow-up by: Cindee Salt MD,  May 30, 2009 1:18 PM  Additional Follow-up for Phone Call Additional follow up Details #1::        spoke with [facility] and they state pt did go to the ER at [facility] and was sent back. Pt is doing well today, per 1st floor tech (didn't get her name).  Additional Follow-up by: Mervin Hack CMA Duncan Dull),  Jun 02, 2009 1:22 PM    Additional Follow-up for Phone Call Additional follow up Details #2::    good to hear Follow-up by: Cindee Salt MD,  Jun 02, 2009 2:16 PM

## 2010-03-03 NOTE — Miscellaneous (Signed)
Summary: Toya Smothers Order/[facility]   Imported By: Lanelle Bal 04/15/2009 09:30:30  _____________________________________________________________________  External Attachment:    Type:   Image     Comment:   External Document

## 2010-03-03 NOTE — Miscellaneous (Signed)
Summary: [facility]  [facility]   Imported By: Maryln Gottron 09/18/2009 14:36:08  _____________________________________________________________________  External Attachment:    Type:   Image     Comment:   External Document

## 2010-03-03 NOTE — Progress Notes (Signed)
Summary: regarding axona  Phone Note Call from Patient   Caller: Daughter Lillia Abed [phone] Call For: Cindee Salt MD Summary of Call: Pt's daughter would like for pt to try axona, she wants your opinion on this, please advise.  She knows you are out until thursday. Initial call taken by: Lowella Petties CMA,  April 08, 2009 11:34 AM  Follow-up for Phone Call        I am not excited that this will be effective but we can try (should be safe though may have some GI problems) Daughter will administer  Order written Please fax to [facility] Follow-up by: Cindee Salt MD,  April 10, 2009 1:59 PM  Additional Follow-up for Phone Call Additional follow up Details #1::        order faxed to [facility] and scanned in chart Additional Follow-up by: Mervin Hack CMA Duncan Dull),  April 10, 2009 3:09 PM    New/Updated Medications: AXONA  PACK (DIETARY MANAGEMENT PRODUCT) 1 pack daily as directed

## 2010-03-03 NOTE — Progress Notes (Signed)
Summary: Questions regarding Home Health    Phone Note Call from Patient   Call For: Cindee Salt MD Summary of Call: Pts [facility] Health Nurse from [facility] called.  Would like to talk w/ the nurse regarding pts needs.Daine Gip  August 18, 2009 11:33 AM Call back # [phone].Marland KitchenDaine Gip  August 18, 2009 11:33 AM  Initial call taken by: Daine Gip,  August 18, 2009 11:33 AM  Follow-up for Phone Call        Trey Paula, physical therapist, says he has noticed some odd neuro presentations and he is requesting your last office note and any other notes that you think might help him with his plan of treatment.  He wants to work with the pt twice a week for 4 weeks. Follow-up by: Lowella Petties CMA,  August 18, 2009 3:52 PM  Additional Follow-up for Phone Call Additional follow up Details #1::        Please call daughter and home health There is no visit that serves as a face to face visit Please set up an appt in the next few weeks to review neuro status and to fufill the requirements for the face to face visit Additional Follow-up by: Cindee Salt MD,  August 18, 2009 7:52 PM    Additional Follow-up for Phone Call Additional follow up Details #2::    Chaney Malling with [facility].  He will continue with PT, pt has appt scheduled here for 7/22. Follow-up by: Lowella Petties CMA,  August 19, 2009 3:39 PM

## 2010-03-05 NOTE — Progress Notes (Signed)
Summary: Orvil Feil requests phone call  Phone Note From Other Clinic   Caller: Orvil Feil at [facility]  [phone] Summary of Call: [name] is concerned that daughter wants to increase pt's zyprexa- she is worried that this will make the pt sleep all the time.  They have been having problems with pt setting off exit alarms and [name] thinks it's time for pt to go to memory care, but she thinks her daughter is in denial.  She is questioning if daughter wants pt to increase zyprexa so that pt will sleep all the time and not try to get out.  [name] is asking that you call her. Initial call taken by: Lowella Petties CMA, AAMA,  February 23, 2010 2:42 PM  Follow-up for Phone Call        discussed situation Daughter wants her to stay in main building They think she needs [facility]  will try higher zyprexa, but all at bedtime (to try to minimize any possible sedation) Message left for daughter Follow-up by: Cindee Salt MD,  February 23, 2010 3:27 PM    New/Updated Medications: ZYPREXA 5 MG TABS (OLANZAPINE) take 1  tablet by mouth in evening

## 2010-03-05 NOTE — Progress Notes (Signed)
Summary: call a nurse  Phone Note Call from Patient   Call For: Cindee Salt MD Summary of Call: Triage Record Num: [id number] Operator: Amy Head Patient Name: Michele Mendoza Call Date & Time: 02/21/2010 1:47:59PM Patient Phone: [phone] PCP: Tillman Abide Patient Gender: Female PCP Fax : [phone] Patient DOB: 1928/08/25 Practice Name: [facility] Reason for Call: Andi/Med tech calling and states that pt daughter was in to see pt at lunch time today, 02/21/10 and wants to know if pt Zyprexa can be changed to BID instead of QHS. Advised to have a nurse follow up with office on 02/23/10. Verbalized understanding. Protocol(s) Used: Office Note Recommended Outcome per Protocol: Information Noted and Sent to Office Reason for Outcome: Caller information to office Care Advice:  ~ 01/ Initial call taken by: Melody Comas,  February 23, 2010 8:11 AM  Follow-up for Phone Call        Please call daughter and find out what her concern is She has been on stable dose for some time Cindee Salt MD  February 23, 2010 10:16 AM   Discussed wtih [name] at [place] see other note message left with daughter explaining plans Follow-up by: Cindee Salt MD,  February 23, 2010 3:28 PM

## 2010-03-11 NOTE — Miscellaneous (Signed)
Summary: Medication order/[facility]  Medication order/[facility]   Imported By: Sherian Rein 03/03/2010 14:36:32  _____________________________________________________________________  External Attachment:    Type:   Image     Comment:   External Document

## 2010-03-19 NOTE — Miscellaneous (Signed)
Summary: orders  orders   Imported By: Kassie Mends 03/09/2010 07:47:42  _____________________________________________________________________  External Attachment:    Type:   Image     Comment:   External Document

## 2010-03-19 NOTE — Miscellaneous (Signed)
Summary: [facility]  [facility]   Imported By: Kassie Mends 03/09/2010 11:43:04  _____________________________________________________________________  External Attachment:    Type:   Image     Comment:   External Document

## 2010-03-25 IMAGING — US ABDOMEN ULTRASOUND
1 series · 17 of 25 positions shown · non-contrast
Comparison: none

REASON FOR EXAM: Anemia Hx Breast CA
COMMENTS:

[Series 1: abdomen ultrasound · 17 of 51 slices shown]
[im 1/51]
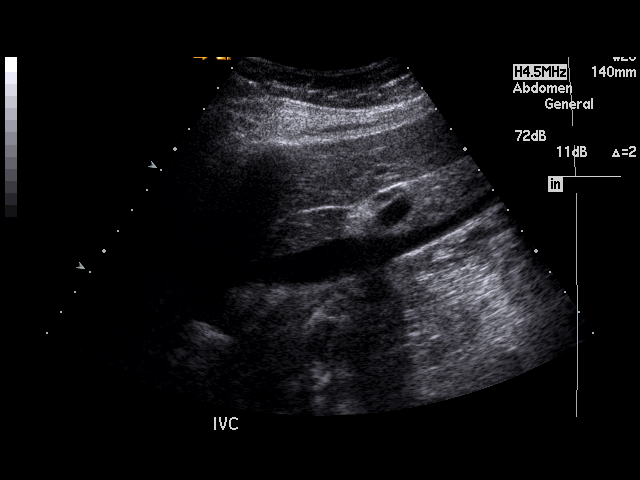
[im 5/51]
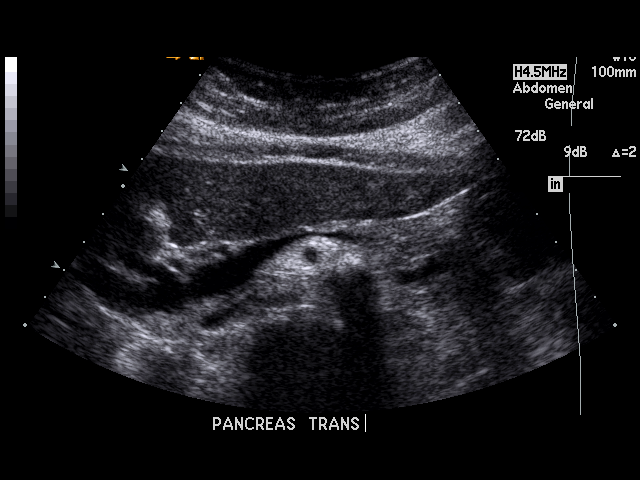
[im 7/51]
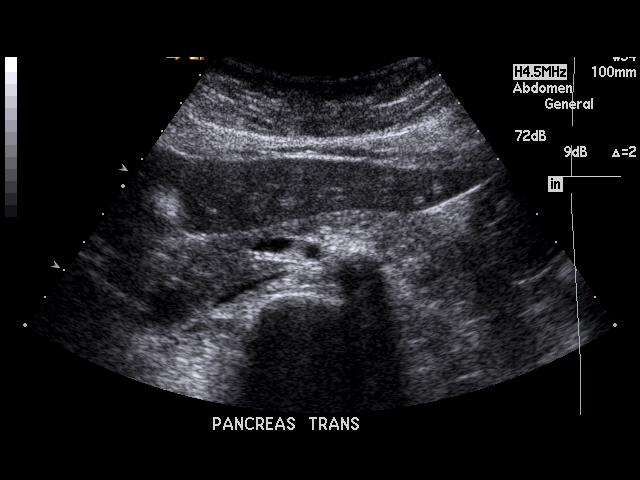
[im 11/51]
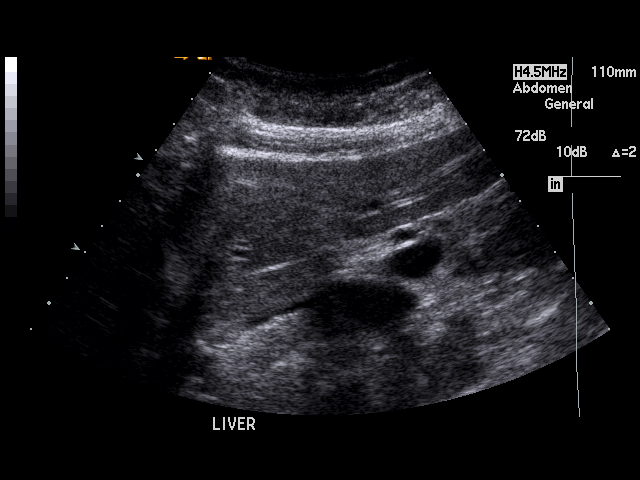
[im 13/51]
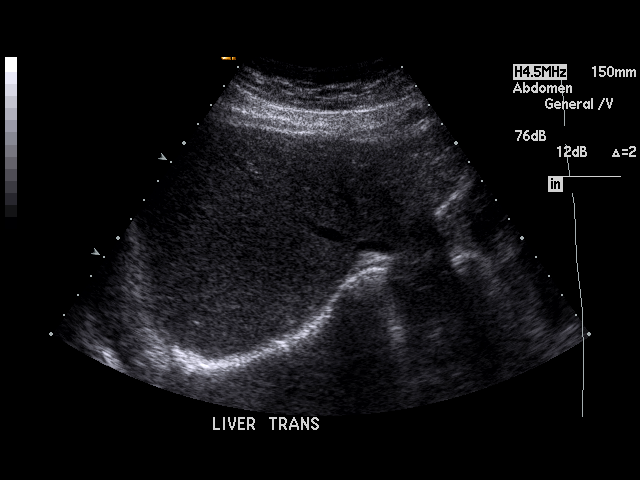
[im 17/51]
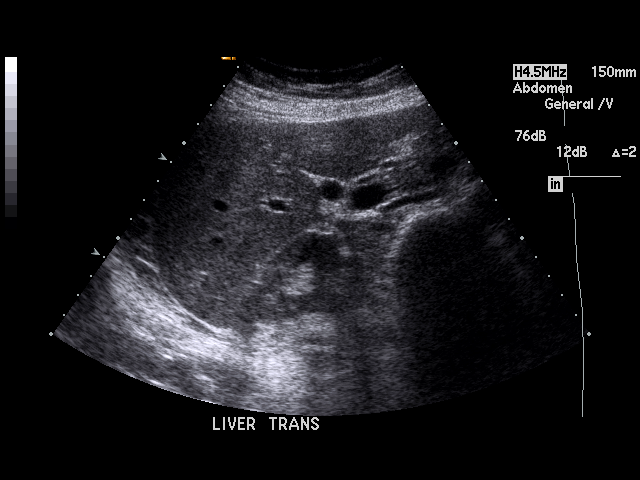
[im 19/51]
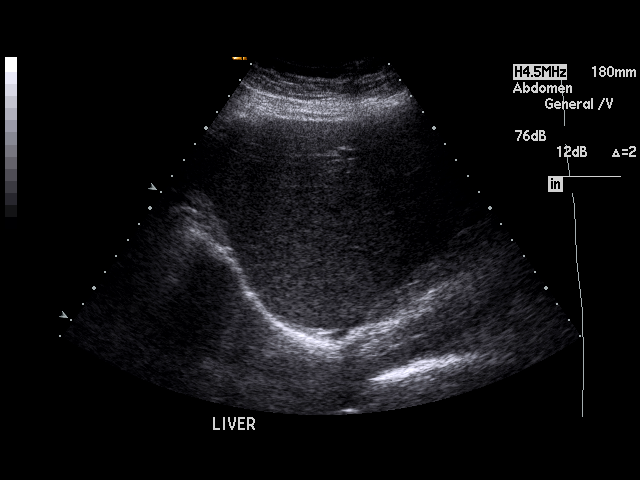
[im 23/51]
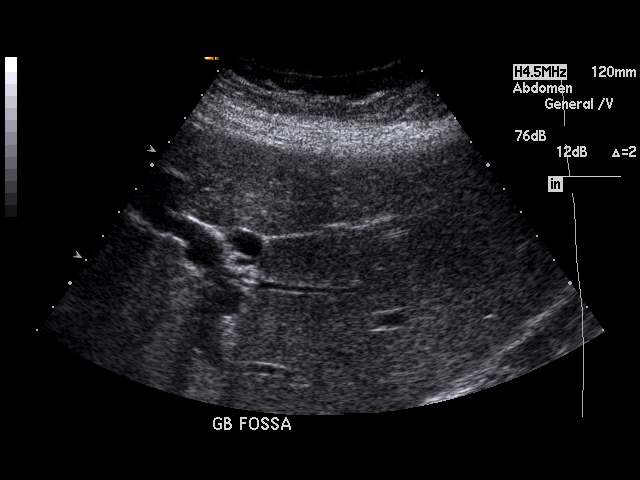
[im 26/51]
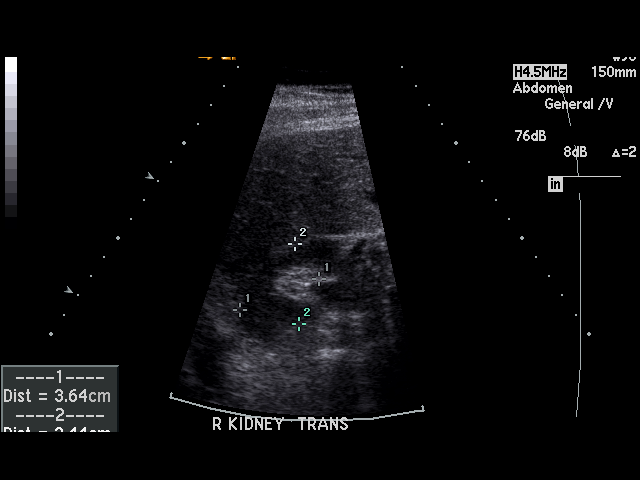
[im 28/51]
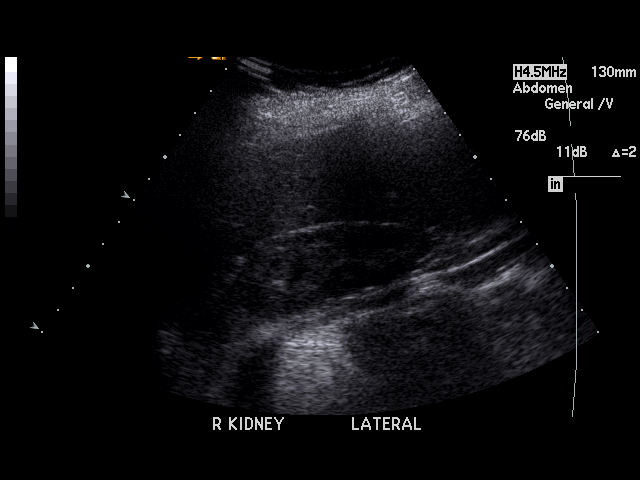
[im 32/51]
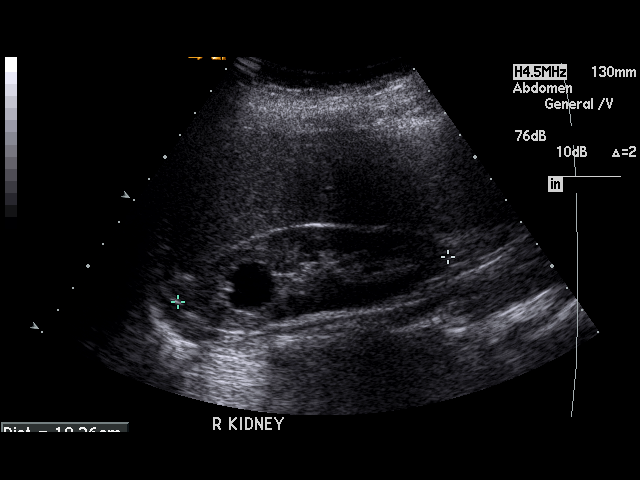
[im 34/51]
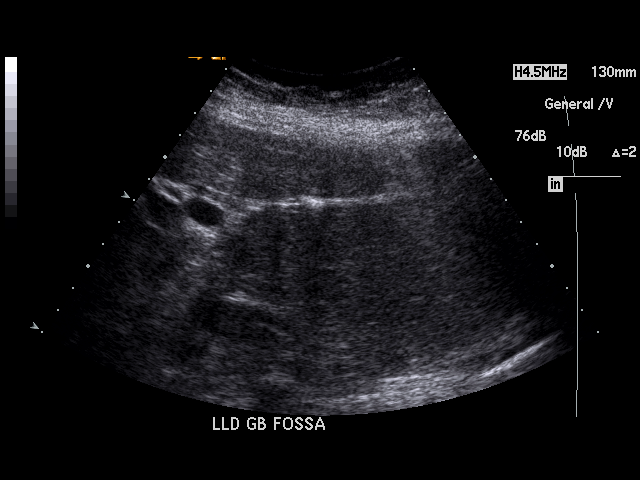
[im 38/51]
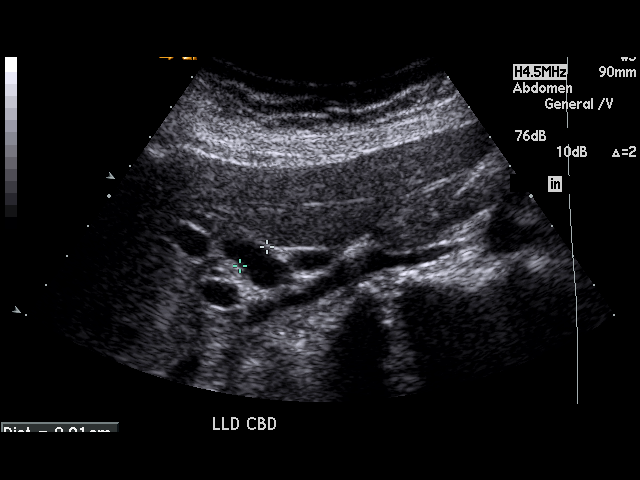
[im 40/51]
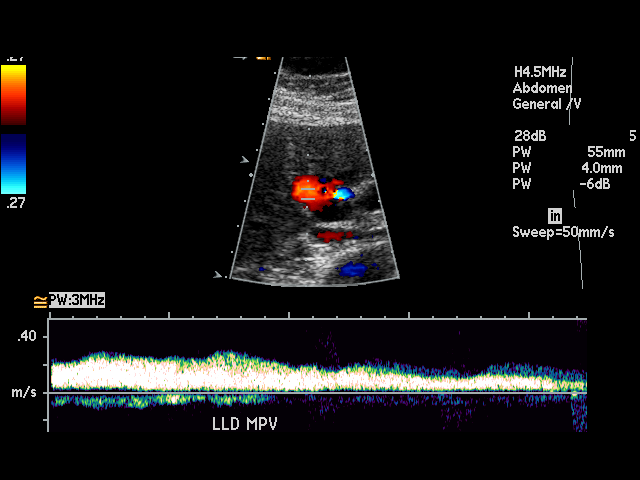
[im 44/51]
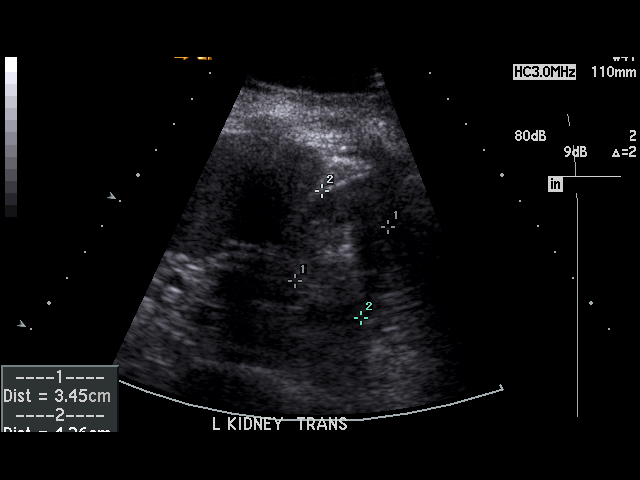
[im 46/51]
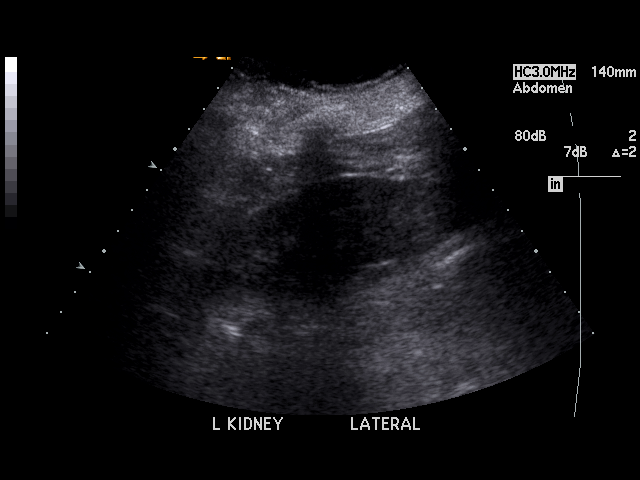
[im 51/51]
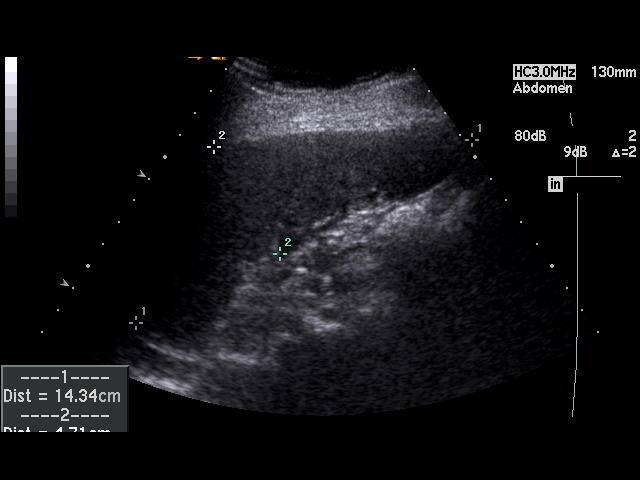

[17 of 25 positions shown; findings below may reference images not displayed]

PROCEDURE:     [facility] - [facility] ABDOMEN GENERAL SURVEY  - October 03, 2007 [DATE]

RESULT:     The liver exhibits normal echotexture with no evidence of a mass
or ductal dilation. Portal venous flow is normal in direction toward the
liver. Reportedly there is a history of cholecystectomy. No gallbladder was
demonstrated. The common bile duct measures 9.2 mm in diameter which would
be consistent with prior cholecystectomy. The pancreas is normal in
appearance. The spleen is very mildly enlarged with maximal dimension of
14.5 cm. The kidneys exhibit no evidence of obstruction.  A 1.9 cm diameter
cyst in the RIGHT kidney is noted.
IMPRESSION: 1. The patient reportedly has undergone cholecystectomy. The gallbladder is
not demonstrated.
2. There is borderline splenomegaly at 14.5 cm.
3. I see no abnormality of the liver or pancreas or abdominal aorta or
kidneys.

## 2010-03-25 NOTE — Miscellaneous (Signed)
Summary: [facility] Standing Doctor's Orders   [facility] Standing Doctor's Orders   Imported By: Kassie Mends 03/17/2010 08:14:27  _____________________________________________________________________  External Attachment:    Type:   Image     Comment:   External Document

## 2010-04-26 ENCOUNTER — Encounter: Payer: Self-pay | Admitting: Internal Medicine

## 2010-04-28 ENCOUNTER — Telehealth: Payer: Self-pay | Admitting: *Deleted

## 2010-04-28 NOTE — Telephone Encounter (Signed)
Doubt x-rays would be needed at this point No reason to consider fracture and she may have arthritis on x-ray  and it still sounds more like an acute injury  Okay to try tylenol for now I am planning visit to [facility] next week but her visit was probably not till 3 weeks or so. If she is not better by then, I can push her visit up to next Wednesday

## 2010-04-28 NOTE — Telephone Encounter (Signed)
Pt's daughter states pt has been complaining of left hip pain for about a week.  No bruises or recent falls.  Says the pain goes down her leg.  She did have a bad fall a few months ago and daughter is concerned pain could be related to that.  Daughter doesn't really want the patient on strong nsaids but says she can take her for x-rays if needed.  Please advise.

## 2010-04-29 NOTE — Telephone Encounter (Signed)
I will try to set this up

## 2010-04-29 NOTE — Telephone Encounter (Signed)
Spoke with daughter and she says not to worry, they have been using tylenol, but daughter would like patient seen next Wednesday for other things if possible ( she didn't say what it was, but she would discuss with you)

## 2010-05-06 ENCOUNTER — Encounter: Payer: Self-pay | Admitting: Internal Medicine

## 2010-05-06 ENCOUNTER — Non-Acute Institutional Stay: Payer: Medicare Other | Admitting: Internal Medicine

## 2010-05-06 VITALS — BP 144/94 | HR 78 | Temp 97.4°F | Resp 22 | Wt 135.0 lb

## 2010-05-06 DIAGNOSIS — G20 Parkinson's disease: Secondary | ICD-10-CM

## 2010-05-06 DIAGNOSIS — D509 Iron deficiency anemia, unspecified: Secondary | ICD-10-CM

## 2010-05-06 DIAGNOSIS — F0280 Dementia in other diseases classified elsewhere without behavioral disturbance: Secondary | ICD-10-CM

## 2010-05-06 DIAGNOSIS — F319 Bipolar disorder, unspecified: Secondary | ICD-10-CM

## 2010-05-06 DIAGNOSIS — G309 Alzheimer's disease, unspecified: Secondary | ICD-10-CM

## 2010-05-06 DIAGNOSIS — M1990 Unspecified osteoarthritis, unspecified site: Secondary | ICD-10-CM

## 2010-05-06 NOTE — Patient Instructions (Signed)
Will plan follow up in 4-6 months 

## 2010-05-06 NOTE — Progress Notes (Signed)
  Subjective:    Patient ID: Michele Mendoza, female    DOB: [birth year], 77 y.o.   MRN: [id number]  HPI Has been doing okay Reviewed care with Michele Mendoza--clinical coordinator Steady decline--has recommended the memory unit but daughter wants to hold off Daughter Michele Mendoza is here  Has bouts of right hip pain at times Rarely gets tylenol--not a fan of pain meds.  Will hold off on standing order unless worsens Some gait problems--right genu varum  Slow decline cognitively Does respond to toileting program still Needs help with bathing and dressing  Mood has been good No irritability Does well when Michele Mendoza takes her out Not depressed  Past Medical History  Diagnosis Date  . Anemia   . Alzheimer's disease   . Bipolar affective disorder, manic   . Neuropathy   . History of recurrent UTIs   . Carcinomas, basal cell   . Arthritis     Past Surgical History  Procedure Date  . Right leg plate from fracture 1970s  . Cholecystectomy   . Anemia 8/09    transfused and procrit----Dr Gittin  . Mania     hospitalized twice  . Lumbar laminectomy     Family History  Problem Relation Age of Onset  . Heart disease Daughter     History   Social History  . Marital Status: Widowed    Spouse Name: N/A    Number of Children: 4  . Years of Education: N/A   Occupational History  . 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 Smoking status: Never Smoker   . Smokeless tobacco: Not on file  . Alcohol Use: No  . Drug Use: No  . Sexually Active: Not on file   Other Topics Concern  . Not on file   Social History Narrative   3 daughters 1 sonDaughter Michele Mendoza--RN or Michele Mendoza have health care POAHas living willDNR in place     Review of Systems Appetite isn't great but fair Weight fairly stable Hearing seems to have worsened Sleeps okay Daughter has noted some movements of tongue--may be related to nerves.     Objective:   Physical Exam  Constitutional: She  appears well-developed and well-nourished. No distress.  Neck: Normal range of motion. Neck supple. No thyromegaly present.  Cardiovascular: Normal rate, regular rhythm and normal heart sounds.  Exam reveals no gallop.   No murmur heard. Pulmonary/Chest: Effort normal and breath sounds normal. No respiratory distress.  Musculoskeletal: She exhibits no edema and no tenderness.  Lymphadenopathy:    She has no cervical adenopathy.  Neurological:       Mild resting tremor in hands Mild bradykinesia No evidence of tardive dyskinesia  Psychiatric:       Distant but does engage a little--mostly with daughter's help Mood neutral, constricted affect          Assessment & Plan:

## 2010-07-02 ENCOUNTER — Non-Acute Institutional Stay: Payer: Medicare Other | Admitting: Internal Medicine

## 2010-07-02 ENCOUNTER — Encounter: Payer: Self-pay | Admitting: Internal Medicine

## 2010-07-02 DIAGNOSIS — D509 Iron deficiency anemia, unspecified: Secondary | ICD-10-CM

## 2010-07-02 DIAGNOSIS — F319 Bipolar disorder, unspecified: Secondary | ICD-10-CM

## 2010-07-02 DIAGNOSIS — M1990 Unspecified osteoarthritis, unspecified site: Secondary | ICD-10-CM

## 2010-07-02 DIAGNOSIS — G309 Alzheimer's disease, unspecified: Secondary | ICD-10-CM

## 2010-07-02 DIAGNOSIS — G20 Parkinson's disease: Secondary | ICD-10-CM

## 2010-07-02 NOTE — Assessment & Plan Note (Signed)
No change in functional status

## 2010-07-02 NOTE — Assessment & Plan Note (Signed)
Some worse Will use tylenol regularly Consider voltaren gel

## 2010-07-02 NOTE — Assessment & Plan Note (Signed)
Mild to moderate Very slow progression On razadyne and namenda

## 2010-07-02 NOTE — Progress Notes (Signed)
Subjective:    Patient ID: Michele Mendoza, female    DOB: [birth year], 77 y.o.   MRN: [id number]  HPI Seen with daughter Michele Mendoza Reviewed with Michele Mendoza the unit coordinator Has adjusted fairly well here Michele Mendoza had become dissatisfied with the care in the other building  Has been having some increased pain in hips, L>R Hasn't been getting the tylenol Michele Mendoza had tried some of her own voltaren gel--seemed to help  Mood has been fairly stable Hasn't been depressed Seems to enjoy the greater freedom moving around in this unit  No chest pain No SOB  Daughter has noticed more tremors in her shoulders Goes way back but may be more noticeable  Current outpatient prescriptions:acetaminophen (TYLENOL) 650 MG CR tablet, Take 650 mg by mouth 2 (two) times daily as needed.  , Disp: , Rfl: ;  Artificial Tear (GENTEAL) GEL, Apply to eye 3 (three) times daily.  , Disp: , Rfl: ;  docusate sodium (COLACE) 100 MG capsule, Take 200 mg by mouth daily.  , Disp: , Rfl:  ferrous fumarate-iron polysaccharide complex (TANDEM) 162-115.2 MG CAPS, Take 1 capsule by mouth daily after lunch daily after lunch.  , Disp: , Rfl: ;  folic acid (FOLVITE) 1 MG tablet, Take 1 mg by mouth daily.  , Disp: , Rfl: ;  galantamine (RAZADYNE ER) 8 MG 24 hr capsule, Take 8 mg by mouth daily with breakfast.  , Disp: , Rfl: ;  lithium 300 MG capsule, Take 300 mg by mouth at bedtime.  , Disp: , Rfl:  memantine (NAMENDA) 10 MG tablet, Take 10 mg by mouth 2 (two) times daily.  , Disp: , Rfl: ;  OLANZapine (ZYPREXA) 5 MG tablet, Take 5 mg by mouth at bedtime.  , Disp: , Rfl: ;  vitamin D, CHOLECALCIFEROL, 400 UNITS tablet, Take 800 Units by mouth daily.  , Disp: , Rfl:   Past Medical History  Diagnosis Date  . Anemia   . Alzheimer's disease   . Bipolar affective disorder, manic   . Neuropathy   . History of recurrent UTIs   . Carcinomas, basal cell   . Arthritis     Past Surgical History  Procedure Date  . Right leg plate from fracture  1970s  . Cholecystectomy   . Anemia 8/09    transfused and procrit----Dr Gittin  . Mania     hospitalized twice  . Lumbar laminectomy     Family History  Problem Relation Age of Onset  . Heart disease Daughter     History   Social History  . Marital Status: Widowed    Spouse Name: N/A    Number of Children: 4  . Years of Education: N/A   Occupational History  . 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 Smoking status: Never Smoker   . Smokeless tobacco: Not on file  . Alcohol Use: No  . Drug Use: No  . Sexually Active: Not on file   Other Topics Concern  . Not on file   Social History Narrative   3 daughters 1 sonDaughter [name]--RN or [name] have health care POAHas living willDNR in place   Review of Systems 3 basal cell cancers biopsied on her legs---getting "burned off" Recent dental work--some changes due to grinding---composite put on lower incisors. Now more comfortable Appetite is fine Weight is stable Sleeps fairly well    Objective:   Physical Exam  Constitutional: She appears well-developed and  well-nourished. No distress.  Neck: Normal range of motion. Neck supple. No thyromegaly present.  Cardiovascular: Normal rate, regular rhythm, normal heart sounds and intact distal pulses.  Exam reveals no gallop.   No murmur heard. Pulmonary/Chest: Effort normal and breath sounds normal. No respiratory distress. She has no wheezes. She has no rales.  Abdominal: Soft. There is no tenderness.  Musculoskeletal: Normal range of motion. She exhibits no edema and no tenderness.  Lymphadenopathy:    She has no cervical adenopathy.  Neurological:       Slight fine tremor in shoulders Fairly normal tone Walks independently still  Psychiatric:       Mood is neutral Affect flat Does answer questions but limited interaction          Assessment & Plan:

## 2010-07-02 NOTE — Patient Instructions (Signed)
Will plan follow up in about 4 months 

## 2010-07-02 NOTE — Assessment & Plan Note (Signed)
Continues on the iron and folate

## 2010-07-02 NOTE — Assessment & Plan Note (Signed)
Has been stable on lithium and zyprexa No changes appropriate

## 2010-09-07 ENCOUNTER — Other Ambulatory Visit: Payer: Self-pay | Admitting: *Deleted

## 2010-09-07 NOTE — Telephone Encounter (Signed)
Faxed requests from pharmacare services are on your desk.

## 2010-09-07 NOTE — Telephone Encounter (Signed)
Okay both for 1 year

## 2010-09-08 MED ORDER — GENTEAL OP GEL: 1.0000 [drp] | Freq: Three times a day (TID) | OPHTHALMIC | Status: AC

## 2010-09-08 MED ORDER — OLANZAPINE 5 MG PO TABS: 5.0000 mg | ORAL_TABLET | Freq: Every day | ORAL | Status: DC

## 2010-09-08 NOTE — Telephone Encounter (Signed)
rx faxed to pharmacy manually  

## 2010-09-09 ENCOUNTER — Other Ambulatory Visit: Payer: Self-pay | Admitting: *Deleted

## 2010-09-09 MED ORDER — VITAMIN D 400 UNITS PO TABS: 800.0000 [IU] | ORAL_TABLET | Freq: Every day | ORAL | Status: DC

## 2010-09-09 NOTE — Telephone Encounter (Signed)
rx faxed to pharmacy manually  

## 2010-09-18 ENCOUNTER — Other Ambulatory Visit: Payer: Self-pay | Admitting: *Deleted

## 2010-09-18 MED ORDER — FOLIC ACID 1 MG PO TABS: 1.0000 mg | ORAL_TABLET | Freq: Every day | ORAL | Status: DC

## 2010-09-18 NOTE — Telephone Encounter (Signed)
rx faxed to pharmacy manually  

## 2010-09-22 ENCOUNTER — Other Ambulatory Visit: Payer: Self-pay | Admitting: *Deleted

## 2010-09-22 MED ORDER — DOCUSATE SODIUM 100 MG PO CAPS: 200.0000 mg | ORAL_CAPSULE | Freq: Every day | ORAL | Status: DC

## 2010-09-22 MED ORDER — GALANTAMINE HYDROBROMIDE ER 8 MG PO CP24: 8.0000 mg | ORAL_CAPSULE | Freq: Every day | ORAL | Status: DC

## 2010-09-22 NOTE — Telephone Encounter (Signed)
rx faxed to pharmacy manually  

## 2010-09-28 ENCOUNTER — Other Ambulatory Visit: Payer: Self-pay | Admitting: *Deleted

## 2010-09-28 MED ORDER — FERROUS FUM-IRON POLYSACCH 162-115.2 MG PO CAPS: 1.0000 | ORAL_CAPSULE | Freq: Every day | ORAL | Status: DC

## 2010-09-28 NOTE — Telephone Encounter (Signed)
Okay #30 x 11 

## 2010-09-28 NOTE — Telephone Encounter (Signed)
Form on your desk, can not e-script to pharmacy, must be faxed manually.

## 2010-09-28 NOTE — Telephone Encounter (Signed)
rx faxed to pharmacy manually  

## 2010-10-29 ENCOUNTER — Non-Acute Institutional Stay: Payer: Medicare Other | Admitting: Internal Medicine

## 2010-10-29 ENCOUNTER — Encounter: Payer: Self-pay | Admitting: Internal Medicine

## 2010-10-29 VITALS — BP 154/90 | HR 64 | Temp 98.4°F | Resp 20 | Wt 137.0 lb

## 2010-10-29 DIAGNOSIS — M1990 Unspecified osteoarthritis, unspecified site: Secondary | ICD-10-CM

## 2010-10-29 DIAGNOSIS — F319 Bipolar disorder, unspecified: Secondary | ICD-10-CM

## 2010-10-29 DIAGNOSIS — G309 Alzheimer's disease, unspecified: Secondary | ICD-10-CM

## 2010-10-29 DIAGNOSIS — G20 Parkinson's disease: Secondary | ICD-10-CM

## 2010-10-29 DIAGNOSIS — F0280 Dementia in other diseases classified elsewhere without behavioral disturbance: Secondary | ICD-10-CM

## 2010-10-29 DIAGNOSIS — D509 Iron deficiency anemia, unspecified: Secondary | ICD-10-CM

## 2010-10-29 NOTE — Assessment & Plan Note (Signed)
No apparent mania or sig irritation Will continue the lithium and zyprexa Wean is not appropriate

## 2010-10-29 NOTE — Progress Notes (Signed)
Subjective:    Patient ID: Michele Mendoza, female    DOB: 08-05-1928, 77 y.o.   MRN: [id number]  HPI Seen with Michele Mendoza the unit coordinator Daughter Michele Mendoza unable to make it  Has adjusted here well In the routines and has some folks here she spends time with ---for meals and activities Appetite is good Weight fairly stable Family will bring her out of facility occ --like to get nails done. This doesn't seem to cause her adjustment issues  Mood generally good No apparent mania or expansive mood  Does have some occ aching but nothing worrisome Seems to get relief from the tylenol  Needs standby assist for bathroom ---she initiates still Some assist with bathing and dressing  Current Outpatient Prescriptions on File Prior to Visit  Medication Sig Dispense Refill  . acetaminophen (TYLENOL) 650 MG CR tablet Take 650 mg by mouth 2 (two) times daily. And bid prn      . Artificial Tear (GENTEAL) GEL Apply 1 drop to eye 3 (three) times daily.  10 mL  11  . docusate sodium (COLACE) 100 MG capsule Take 2 capsules (200 mg total) by mouth daily.  10 capsule  11  . ferrous fumarate-iron polysaccharide complex (TANDEM) 162-115.2 MG CAPS Take 1 capsule by mouth daily at 2 PM daily at 2 PM.  30 capsule  11  . folic acid (FOLVITE) 1 MG tablet Take 1 tablet (1 mg total) by mouth daily.  30 tablet  11  . galantamine (RAZADYNE ER) 8 MG 24 hr capsule Take 1 capsule (8 mg total) by mouth daily with breakfast.  30 capsule  11  . lithium 300 MG capsule Take 300 mg by mouth at bedtime.        . memantine (NAMENDA) 10 MG tablet Take 10 mg by mouth 2 (two) times daily.        Marland Kitchen OLANZapine (ZYPREXA) 5 MG tablet Take 1 tablet (5 mg total) by mouth at bedtime.  30 tablet  11  . vitamin D, CHOLECALCIFEROL, 400 UNITS tablet Take 2 tablets (800 Units total) by mouth daily.  60 tablet  11    Allergies  Allergen Reactions  . Ciprofloxacin Hcl     REACTION: ash  . Codeine Sulfate     REACTION: hives  . Divalproex  Sodium     REACTION: anemia  . Epinephrine Hcl     REACTION: needs plain lidocaine if local needed  . Penicillins     REACTION: rash and hives  . Prednisone     REACTION: triggers her bipolar  . Sulfamethoxazole W/Trimethoprim     REACTION: hives    Past Medical History  Diagnosis Date  . Anemia   . Alzheimer's disease   . Bipolar affective disorder, manic   . Neuropathy   . History of recurrent UTIs   . Carcinomas, basal cell   . Arthritis     Past Surgical History  Procedure Date  . Right leg plate from fracture 1970s  . Cholecystectomy   . Anemia 8/09    transfused and procrit----Dr Gittin  . Mania     hospitalized twice  . Lumbar laminectomy     Family History  Problem Relation Age of Onset  . Heart disease Daughter     History   Social History  . Marital Status: Widowed    Spouse Name: N/A    Number of Children: 4  . Years of Education: N/A   Occupational History  . 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 Smoking status: Never Smoker   . Smokeless tobacco: Not on file  . Alcohol Use: No  . Drug Use: No  . Sexually Active: Not on file   Other Topics Concern  . Not on file   Social History Narrative   3 daughters 1 sonDaughter Michele Mendoza--RN or Michele Mendoza have health care POAHas living willDNR in place   Review of Systems No chest pain No SOB Sleeps okay Bowels are okay    Objective:   Physical Exam  Constitutional: She appears well-developed and well-nourished. No distress.  Neck: Normal range of motion. Neck supple. No thyromegaly present.  Cardiovascular: Normal rate, regular rhythm and normal heart sounds.  Exam reveals no gallop.   No murmur heard. Pulmonary/Chest: Effort normal and breath sounds normal. No respiratory distress. She has no wheezes. She has no rales.  Abdominal: Soft. There is no tenderness.  Musculoskeletal: Normal range of motion. She exhibits no edema and no tenderness.  Lymphadenopathy:     She has no cervical adenopathy.  Neurological:       Engages with coaxing Interacts okay   Psychiatric:       Mood is neutral Somewhat flattened affect          Assessment & Plan:

## 2010-10-29 NOTE — Patient Instructions (Signed)
Will plan follow up in about 4 months 

## 2010-10-29 NOTE — Assessment & Plan Note (Signed)
Probably related to the dementia No signs of tardive dyskinesia on the antipsychotic

## 2010-10-29 NOTE — Assessment & Plan Note (Signed)
Still mild  Doing well here in assisted living Continues on razadyne and [place]

## 2010-10-29 NOTE — Assessment & Plan Note (Signed)
Continues on iron and folate

## 2010-10-29 NOTE — Assessment & Plan Note (Signed)
Doing okay on the tylenol

## 2011-01-01 ENCOUNTER — Telehealth: Payer: Self-pay | Admitting: *Deleted

## 2011-01-01 ENCOUNTER — Encounter: Payer: Self-pay | Admitting: Internal Medicine

## 2011-01-01 ENCOUNTER — Ambulatory Visit (INDEPENDENT_AMBULATORY_CARE_PROVIDER_SITE_OTHER): Payer: Medicare Other | Admitting: Internal Medicine

## 2011-01-01 VITALS — BP 128/64 | HR 62 | Temp 98.4°F | Wt 141.8 lb

## 2011-01-01 DIAGNOSIS — R300 Dysuria: Secondary | ICD-10-CM

## 2011-01-01 DIAGNOSIS — N390 Urinary tract infection, site not specified: Secondary | ICD-10-CM | POA: Insufficient documentation

## 2011-01-01 DIAGNOSIS — F319 Bipolar disorder, unspecified: Secondary | ICD-10-CM

## 2011-01-01 LAB — POCT URINALYSIS DIPSTICK
Bilirubin, UA: NEGATIVE
Glucose, UA: NEGATIVE
Ketones, UA: NEGATIVE
Spec Grav, UA: <=1.005

## 2011-01-01 MED ORDER — NITROFURANTOIN MACROCRYSTAL 50 MG PO CAPS: 50.0000 mg | ORAL_CAPSULE | Freq: Two times a day (BID) | ORAL | Status: AC

## 2011-01-01 NOTE — Assessment & Plan Note (Signed)
Not clear cut but best bet for her low grade fever and being out of sorts Limited choice for antibiotics Will use low dose macrodantin

## 2011-01-01 NOTE — Telephone Encounter (Signed)
Pt has been shaking a lot more than usual, daughter wants to know if you want pt to have labs (she is on lithium) or or to see pt.  Her bp was 120/64

## 2011-01-01 NOTE — Telephone Encounter (Signed)
I spoke to daughter Will see today at 4:15 and check urine Will do labs also while she is here

## 2011-01-01 NOTE — Progress Notes (Signed)
Subjective:    Patient ID: Michele Mendoza, female    DOB: 01-10-1929, 77 y.o.   MRN: [id number]  HPI Has had increased shakiness and jerking since yesterday Rapid hand and arm jerks mostly Actually spilled coffee  Fever--low grade--started this AM Increased sleeping Needed help to eat--daughter fed her  No specific urinary symptoms that we can tell May have had pullup on overnight and not been changed right away in AM a couple of days ago  No cough No SOB  Current Outpatient Prescriptions on File Prior to Visit  Medication Sig Dispense Refill  . acetaminophen (TYLENOL) 650 MG CR tablet Take 650 mg by mouth 2 (two) times daily. And bid prn      . Artificial Tear (GENTEAL) GEL Apply 1 drop to eye 3 (three) times daily.  10 mL  11  . docusate sodium (COLACE) 100 MG capsule Take 2 capsules (200 mg total) by mouth daily.  10 capsule  11  . ferrous fumarate-iron polysaccharide complex (TANDEM) 162-115.2 MG CAPS Take 1 capsule by mouth daily at 2 PM daily at 2 PM.  30 capsule  11  . folic acid (FOLVITE) 1 MG tablet Take 1 tablet (1 mg total) by mouth daily.  30 tablet  11  . galantamine (RAZADYNE ER) 8 MG 24 hr capsule Take 1 capsule (8 mg total) by mouth daily with breakfast.  30 capsule  11  . lithium 300 MG capsule Take 300 mg by mouth at bedtime.        . memantine (NAMENDA) 10 MG tablet Take 10 mg by mouth 2 (two) times daily.        Marland Kitchen OLANZapine (ZYPREXA) 5 MG tablet Take 1 tablet (5 mg total) by mouth at bedtime.  30 tablet  11  . vitamin D, CHOLECALCIFEROL, 400 UNITS tablet Take 2 tablets (800 Units total) by mouth daily.  60 tablet  11    Allergies  Allergen Reactions  . Ciprofloxacin Hcl     REACTION: ash  . Codeine Sulfate     REACTION: hives  . Divalproex Sodium     REACTION: anemia  . Epinephrine Hcl     REACTION: needs plain lidocaine if local needed  . Penicillins     REACTION: rash and hives  . Prednisone     REACTION: triggers her bipolar  . Sulfamethoxazole  W/Trimethoprim     REACTION: hives    Past Medical History  Diagnosis Date  . Anemia   . Alzheimer's disease   . Bipolar affective disorder, manic   . Neuropathy   . History of recurrent UTIs   . Carcinomas, basal cell   . Arthritis     Past Surgical History  Procedure Date  . Right leg plate from fracture 1970s  . Cholecystectomy   . Anemia 8/09    transfused and procrit----Dr Gittin  . Mania     hospitalized twice  . Lumbar laminectomy     Family History  Problem Relation Age of Onset  . Heart disease Daughter     History   Social History  . Marital Status: Widowed    Spouse Name: N/A    Number of Children: 4  . Years of Education: N/A   Occupational History  . 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 Smoking status: Never Smoker   . Smokeless tobacco: Not on file  . Alcohol Use: No  . Drug Use: No  .  Sexually Active: Not on file   Other Topics Concern  . Not on file   Social History Narrative   3 daughters 1 sonDaughter [name]--RN or [name] have health care POAHas living willDNR in place   Review of Systems Had good deep teeth cleaning recently---6 cavities Doesn't brush her teeth well No nausea or vomiting Bowels okay    Objective:   Physical Exam  Constitutional: She appears well-developed and well-nourished.       Somnolent and not as alert as usual  Neck: Normal range of motion. Neck supple.  Pulmonary/Chest: Effort normal and breath sounds normal. No respiratory distress. She has no wheezes. She has no rales.  Abdominal: Soft.       Mild suprapubic tenderness  Lymphadenopathy:    She has no cervical adenopathy.          Assessment & Plan:

## 2011-01-01 NOTE — Progress Notes (Signed)
Addended by: Tillman Abide I on: 01/01/2011 04:57 PM   Modules accepted: Orders

## 2011-01-01 NOTE — Assessment & Plan Note (Signed)
Stable  Will check labs as long as she is here

## 2011-01-02 LAB — BASIC METABOLIC PANEL
Chloride: 108 mEq/L (ref 96–112)
Creat: 1.28 mg/dL — ABNORMAL HIGH (ref 0.50–1.10)
Sodium: 139 mEq/L (ref 135–145)

## 2011-01-02 LAB — CBC WITH DIFFERENTIAL/PLATELET
Eosinophils Absolute: 0.2 10*3/uL (ref 0.0–0.7)
HCT: 32.5 % — ABNORMAL LOW (ref 36.0–46.0)
Hemoglobin: 10.7 g/dL — ABNORMAL LOW (ref 12.0–15.0)
Lymphs Abs: 1.3 10*3/uL (ref 0.7–4.0)
MCH: 31.9 pg (ref 26.0–34.0)
Monocytes Absolute: 0.5 10*3/uL (ref 0.1–1.0)
Monocytes Relative: 8 % (ref 3–12)
Neutro Abs: 4.7 10*3/uL (ref 1.7–7.7)
Neutrophils Relative %: 71 % (ref 43–77)
RBC: 3.35 MIL/uL — ABNORMAL LOW (ref 3.87–5.11)

## 2011-01-02 LAB — HEPATIC FUNCTION PANEL
AST: 21 U/L (ref 0–37)
Alkaline Phosphatase: 36 U/L — ABNORMAL LOW (ref 39–117)
Total Bilirubin: 0.2 mg/dL — ABNORMAL LOW (ref 0.3–1.2)

## 2011-01-02 LAB — TSH: TSH: 1.293 u[IU]/mL (ref 0.350–4.500)

## 2011-01-13 ENCOUNTER — Telehealth: Payer: Self-pay | Admitting: *Deleted

## 2011-01-13 NOTE — Telephone Encounter (Signed)
Spoke with daughter and advised results. Faxed to [phone] her work number.

## 2011-01-13 NOTE — Telephone Encounter (Signed)
Lillia Abed (daughter who works with [facility]osp[facility]) is asking if she can get a copy of Ms. Kahrs's recent lab results?  Also, she lost her blue card and has not received any results.  Can you please let her know.

## 2011-02-04 ENCOUNTER — Telehealth: Payer: Self-pay | Admitting: *Deleted

## 2011-02-04 NOTE — Telephone Encounter (Signed)
Debbie from [employer] calling because the patient's daughter [name] from [facility] calling her wanting patient evaluated for a UTI. Debbie calling for Dr.Letvak which I told her he's not in, per daughter she doesn't think the UTI patient had back in November is completely gone, she would like another abx called in. I advised [name] that the patient would need to be seen, debbie talked with the daughter and she would like to drop a urine sample off to be checked. I advised we couldn't do that and that I could schedule an appt for tomorrow or ask another physician if they could add her on? Per debbie she asked the daughter to take the pt to [facility], pt's daughter stated that Dr.Letvak wouldn't want her to go to [facility]. Per debbie she will have daughter call here for appointment.

## 2011-02-06 NOTE — Telephone Encounter (Signed)
We can send a urine culture out---see if they can just bring it to [facility] or [facility] if I send an order  I agree that just treating her would not be appropriate

## 2011-02-09 NOTE — Telephone Encounter (Signed)
Spoke with Orvil Feil because [name] was not in, [name] will check and see if a culture is still needed.

## 2011-02-24 ENCOUNTER — Telehealth: Payer: Self-pay | Admitting: Internal Medicine

## 2011-02-24 NOTE — Telephone Encounter (Signed)
Patient has been lethargic.  She's not talking much and hard for her to stay awake.  She was found at the edge of her couch this morning.  It looked like she slid off the couch and she wasn't hurt.  Can her Zyprex or Lithium be cut back?  If order needs to be faxed to [facility] please fax the order to [phone].

## 2011-02-25 NOTE — Telephone Encounter (Signed)
I expect to visit [facility] at the end of February

## 2011-02-25 NOTE — Telephone Encounter (Signed)
Spoke with daughter and she doesn't think pt needs to be seen, she just thinks she just drowsy and maybe coming from her end stage Alzheimer . Daughter states would like to speak with you about her medications but she can wait until her next visit. Per daughter if anything changes for the worse she will call, she couldn't come today because of work.

## 2011-02-25 NOTE — Telephone Encounter (Signed)
I would not do that without an evaluation Please call daughter Michele Mendoza and see if she wants to bring her into the office. Can add on at 4:45 today or put on schedule tomorrow sometime

## 2011-03-02 ENCOUNTER — Ambulatory Visit (INDEPENDENT_AMBULATORY_CARE_PROVIDER_SITE_OTHER): Payer: Medicare Other | Admitting: Internal Medicine

## 2011-03-02 ENCOUNTER — Encounter: Payer: Self-pay | Admitting: Internal Medicine

## 2011-03-02 VITALS — BP 128/70 | HR 71 | Temp 98.2°F | Ht 66.0 in | Wt 136.0 lb

## 2011-03-02 DIAGNOSIS — F319 Bipolar disorder, unspecified: Secondary | ICD-10-CM

## 2011-03-02 DIAGNOSIS — F0280 Dementia in other diseases classified elsewhere without behavioral disturbance: Secondary | ICD-10-CM

## 2011-03-02 DIAGNOSIS — N390 Urinary tract infection, site not specified: Secondary | ICD-10-CM

## 2011-03-02 DIAGNOSIS — R300 Dysuria: Secondary | ICD-10-CM

## 2011-03-02 LAB — POCT URINALYSIS DIPSTICK
Bilirubin, UA: NEGATIVE
Glucose, UA: NEGATIVE
Ketones, UA: NEGATIVE

## 2011-03-02 NOTE — Assessment & Plan Note (Signed)
Has recent change in mental status, new incontinence, abd tenderness Will send C&S Empiric Rx with macrodantin due to allergies---will adjust based on culture

## 2011-03-02 NOTE — Assessment & Plan Note (Signed)
With worsening of dementia, unclear how active this is Concerned about side effects Will try off the lithium Consider weaning the zyprexa when I see her last month

## 2011-03-02 NOTE — Assessment & Plan Note (Signed)
Clearly progressing May rethink the galantamine over time Continue namenda for now

## 2011-03-02 NOTE — Progress Notes (Signed)
Subjective:    Patient ID: Michele Mendoza, female    DOB: February 15, 1928, 78 y.o.   MRN: [id number]  HPI Here with daughter  She has not been right for 1-2 weeks Initially sleeping more and this has persisted Trouble walking---more shuffling Some increased drooling  Hands turn blue at times Abdomen seems distended No vomiting Eating okay Bowels seem to be okay---black due to iron (apparently)  No cough or SOB Low grade fever by touch but only 99.4 or so (on routine tylenol though)  Has been having daytime incontinence for the first time No obvious dysuria Always has foul odor  Current Outpatient Prescriptions on File Prior to Visit  Medication Sig Dispense Refill  . acetaminophen (TYLENOL) 650 MG CR tablet Take 650 mg by mouth 2 (two) times daily. And bid prn      . Artificial Tear (GENTEAL) GEL Apply 1 drop to eye 3 (three) times daily.  10 mL  11  . docusate sodium (COLACE) 100 MG capsule Take 2 capsules (200 mg total) by mouth daily.  10 capsule  11  . ferrous fumarate-iron polysaccharide complex (TANDEM) 162-115.2 MG CAPS Take 1 capsule by mouth daily at 2 PM daily at 2 PM.  30 capsule  11  . folic acid (FOLVITE) 1 MG tablet Take 1 tablet (1 mg total) by mouth daily.  30 tablet  11  . galantamine (RAZADYNE ER) 8 MG 24 hr capsule Take 1 capsule (8 mg total) by mouth daily with breakfast.  30 capsule  11  . lithium 300 MG capsule Take 300 mg by mouth at bedtime.        . memantine (NAMENDA) 10 MG tablet Take 10 mg by mouth 2 (two) times daily.        Marland Kitchen OLANZapine (ZYPREXA) 5 MG tablet Take 1 tablet (5 mg total) by mouth at bedtime.  30 tablet  11  . vitamin D, CHOLECALCIFEROL, 400 UNITS tablet Take 2 tablets (800 Units total) by mouth daily.  60 tablet  11    Allergies  Allergen Reactions  . Ciprofloxacin Hcl     REACTION: ash  . Codeine Sulfate     REACTION: hives  . Divalproex Sodium     REACTION: anemia  . Epinephrine Hcl     REACTION: needs plain lidocaine if local  needed  . Penicillins     REACTION: rash and hives  . Prednisone     REACTION: triggers her bipolar  . Sulfamethoxazole W/Trimethoprim     REACTION: hives    Past Medical History  Diagnosis Date  . Anemia   . Alzheimer's disease   . Bipolar affective disorder, manic   . Neuropathy   . History of recurrent UTIs   . Carcinomas, basal cell   . Arthritis     Past Surgical History  Procedure Date  . Right leg plate from fracture 1970s  . Cholecystectomy   . Anemia 8/09    transfused and procrit----Dr Gittin  . Mania     hospitalized twice  . Lumbar laminectomy     Family History  Problem Relation Age of Onset  . Heart disease Daughter     History   Social History  . Marital Status: Widowed    Spouse Name: N/A    Number of Children: 4  . Years of Education: N/A   Occupational History  . 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 Smoking status: Never Smoker   .  Smokeless tobacco: Never Used  . Alcohol Use: No  . Drug Use: No  . Sexually Active: Not on file   Other Topics Concern  . Not on file   Social History Narrative   3 daughters 1 sonDaughter [name]--RN or [name] have health care POAHas living willDNR in place   Review of Systems Needs assist with transfers out of bed and chair Did have fall last week---found sitting next to bed     Objective:   Physical Exam  Constitutional: She appears well-developed and well-nourished. No distress.  Neck: Normal range of motion. Neck supple. No thyromegaly present.  Cardiovascular: Normal rate, regular rhythm and normal heart sounds.   Pulmonary/Chest: Effort normal and breath sounds normal. No respiratory distress. She has no wheezes. She has no rales.  Abdominal: Soft.       Mild diffuse tenderness  Lymphadenopathy:    She has no cervical adenopathy.  Neurological:       Sleeping Does cooperate          Assessment & Plan:

## 2011-03-05 LAB — URINE CULTURE: Colony Count: >=100000

## 2011-04-01 ENCOUNTER — Non-Acute Institutional Stay: Payer: Medicare Other | Admitting: Internal Medicine

## 2011-04-01 ENCOUNTER — Encounter: Payer: Self-pay | Admitting: Internal Medicine

## 2011-04-01 VITALS — BP 140/80 | HR 80 | Temp 97.8°F | Resp 20 | Wt 145.0 lb

## 2011-04-01 DIAGNOSIS — F0280 Dementia in other diseases classified elsewhere without behavioral disturbance: Secondary | ICD-10-CM

## 2011-04-01 DIAGNOSIS — G20 Parkinson's disease: Secondary | ICD-10-CM

## 2011-04-01 DIAGNOSIS — G309 Alzheimer's disease, unspecified: Secondary | ICD-10-CM

## 2011-04-01 DIAGNOSIS — F319 Bipolar disorder, unspecified: Secondary | ICD-10-CM

## 2011-04-01 DIAGNOSIS — M1990 Unspecified osteoarthritis, unspecified site: Secondary | ICD-10-CM

## 2011-04-01 DIAGNOSIS — G20A1 Parkinson's disease without dyskinesia, without mention of fluctuations: Secondary | ICD-10-CM

## 2011-04-01 NOTE — Assessment & Plan Note (Signed)
Continues to have slow steady decline Will consider trial off galantamine if she is stable next time off the zyprexa Would continue the memantine though

## 2011-04-01 NOTE — Progress Notes (Signed)
Subjective:    Patient ID: Michele Mendoza, female    DOB: Jun 26, 1928, 78 y.o.   MRN: [id number]  HPI Daughter no able to make it today Seen with [name] the unit coordinator  Doing fairly well Has not seemed to have any problems with the lithium off No significant mood lability Doesn't appear depressed Still somewhat flat---but probably part of her Parkinsonism  No urinary symptoms now No nausea or vomiting Has trouble swallowing the colace---will stop Would change to miralax if bowels a problem off the colace  Functional status stable Needs assist with all ADLs Able to walk slowly  Current Outpatient Prescriptions on File Prior to Visit  Medication Sig Dispense Refill  . acetaminophen (TYLENOL) 650 MG CR tablet Take 650 mg by mouth 2 (two) times daily. And bid prn      . Artificial Tear (GENTEAL) GEL Apply 1 drop to eye 3 (three) times daily.  10 mL  11  . docusate sodium (COLACE) 100 MG capsule Take 2 capsules (200 mg total) by mouth daily.  10 capsule  11  . ferrous fumarate-iron polysaccharide complex (TANDEM) 162-115.2 MG CAPS Take 1 capsule by mouth daily at 2 PM daily at 2 PM.  30 capsule  11  . folic acid (FOLVITE) 1 MG tablet Take 1 tablet (1 mg total) by mouth daily.  30 tablet  11  . galantamine (RAZADYNE ER) 8 MG 24 hr capsule Take 1 capsule (8 mg total) by mouth daily with breakfast.  30 capsule  11  . memantine (NAMENDA) 10 MG tablet Take 10 mg by mouth 2 (two) times daily.        Marland Kitchen OLANZapine (ZYPREXA) 5 MG tablet Take 1 tablet (5 mg total) by mouth at bedtime.  30 tablet  11  . vitamin D, CHOLECALCIFEROL, 400 UNITS tablet Take 2 tablets (800 Units total) by mouth daily.  60 tablet  11    Allergies  Allergen Reactions  . Ciprofloxacin Hcl     REACTION: ash  . Codeine Sulfate     REACTION: hives  . Divalproex Sodium     REACTION: anemia  . Epinephrine Hcl     REACTION: needs plain lidocaine if local needed  . Penicillins     REACTION: rash and hives  .  Prednisone     REACTION: triggers her bipolar  . Sulfamethoxazole W/Trimethoprim     REACTION: hives    Past Medical History  Diagnosis Date  . Anemia   . Alzheimer's disease   . Bipolar affective disorder, manic   . Neuropathy   . History of recurrent UTIs   . Carcinomas, basal cell   . Arthritis     Past Surgical History  Procedure Date  . Right leg plate from fracture 1970s  . Cholecystectomy   . Anemia 8/09    transfused and procrit----Dr Gittin  . Mania     hospitalized twice  . Lumbar laminectomy     Family History  Problem Relation Age of Onset  . Heart disease Daughter     History   Social History  . Marital Status: Widowed    Spouse Name: N/A    Number of Children: 4  . Years of Education: N/A   Occupational History  . 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 Smoking status: Never Smoker   . Smokeless tobacco: Never Used  . Alcohol Use: No  . Drug Use: No  . Sexually  Active: Not on file   Other Topics Concern  . Not on file   Social History Narrative   3 daughters 1 sonDaughter [name]--RN or [name] have health care POAHas living willDNR in place   Review of Systems Appetite is fine Weight up a few pounds Sleeps well     Objective:   Physical Exam  Constitutional: She appears well-developed and well-nourished. No distress.  Neck: Normal range of motion. Neck supple.  Cardiovascular: Normal rate, regular rhythm and normal heart sounds.  Exam reveals no gallop.   No murmur heard. Pulmonary/Chest: Effort normal and breath sounds normal. No respiratory distress. She has no wheezes. She has no rales.  Abdominal: Soft. There is no tenderness.  Musculoskeletal: She exhibits no edema and no tenderness.  Lymphadenopathy:    She has no cervical adenopathy.  Neurological:       Mild bradykinesia but fairly normal tone  Psychiatric:       Smiles Interacts well and seems in good spirits          Assessment &  Plan:

## 2011-04-01 NOTE — Assessment & Plan Note (Signed)
Seems fine even off the lithium As I planned with daughter, will try to wean her off the zyprexa

## 2011-04-01 NOTE — Assessment & Plan Note (Signed)
Doesn't appear to be worse It will be good to have her off the zyprexa

## 2011-04-01 NOTE — Assessment & Plan Note (Signed)
Does move slowly but pain seems controlled on the tylenol--and hasn't needed the prn

## 2011-04-28 ENCOUNTER — Other Ambulatory Visit: Payer: Self-pay | Admitting: *Deleted

## 2011-04-28 MED ORDER — MEMANTINE HCL 10 MG PO TABS: 10.0000 mg | ORAL_TABLET | Freq: Two times a day (BID) | ORAL | Status: DC

## 2011-07-01 ENCOUNTER — Telehealth: Payer: Self-pay | Admitting: Internal Medicine

## 2011-07-01 NOTE — Telephone Encounter (Signed)
Michele Mendoza was calling about trying to get Michele Mendoza back on [place] but in liquid form this time. They wanted to know if an order could be faxed over to them.

## 2011-07-01 NOTE — Telephone Encounter (Signed)
Order written Will fax to the [facility] list update

## 2011-07-29 ENCOUNTER — Other Ambulatory Visit: Payer: Self-pay | Admitting: *Deleted

## 2011-07-29 MED ORDER — ACETAMINOPHEN ER 650 MG PO TBCR: 650.0000 mg | EXTENDED_RELEASE_TABLET | Freq: Two times a day (BID) | ORAL | Status: DC | PRN

## 2011-07-29 NOTE — Telephone Encounter (Signed)
rx faxed to pharmacy thru [facility]  

## 2011-09-16 ENCOUNTER — Encounter: Payer: Self-pay | Admitting: Internal Medicine

## 2011-09-16 ENCOUNTER — Non-Acute Institutional Stay: Payer: Medicare Other | Admitting: Internal Medicine

## 2011-09-16 VITALS — BP 144/61 | HR 72 | Temp 97.4°F | Resp 20 | Wt 136.0 lb

## 2011-09-16 DIAGNOSIS — G309 Alzheimer's disease, unspecified: Secondary | ICD-10-CM

## 2011-09-16 DIAGNOSIS — M1990 Unspecified osteoarthritis, unspecified site: Secondary | ICD-10-CM

## 2011-09-16 DIAGNOSIS — D509 Iron deficiency anemia, unspecified: Secondary | ICD-10-CM

## 2011-09-16 DIAGNOSIS — G20 Parkinson's disease: Secondary | ICD-10-CM

## 2011-09-16 DIAGNOSIS — F319 Bipolar disorder, unspecified: Secondary | ICD-10-CM

## 2011-09-16 DIAGNOSIS — F0280 Dementia in other diseases classified elsewhere without behavioral disturbance: Secondary | ICD-10-CM

## 2011-09-16 NOTE — Assessment & Plan Note (Signed)
Supplements Will check labs after next visit

## 2011-09-16 NOTE — Assessment & Plan Note (Signed)
Mild to moderate Has been fairly stable Will continue the razydyne

## 2011-09-16 NOTE — Assessment & Plan Note (Signed)
No mania and mood is stable

## 2011-09-16 NOTE — Progress Notes (Signed)
Subjective:    Patient ID: Michele Mendoza, female    DOB: [birth year], 78 y.o.   MRN: [id number]  HPI Daughter unable to make it Reviewed with unit coordinator Debbie  Has done generally well Now off both lithium and zyprexa No apparent mood instability or manic behavior No apparent depression  Some mouth movements--tongue movements Some small steps and slow gait No falls Dances some with music though  Brushes her hair---staff mostly needs to do rest of care like bathing, teeth brushing Needs hands on assist with dressing toileting program and generally continent. Doesn't usually indicate her own needs  No chest pain No breathing problems  Current Outpatient Prescriptions on File Prior to Visit  Medication Sig Dispense Refill  . acetaminophen (TYLENOL) 650 MG CR tablet Take 1 tablet (650 mg total) by mouth 2 (two) times daily as needed for pain.  60 tablet  11  . Artificial Tear (GENTEAL) GEL Apply 1 drop to eye 3 (three) times daily.  10 mL  11  . Docusate Sodium (COLACE) 150 MG/15ML syrup Take 100 mg by mouth daily.      . ferrous fumarate-iron polysaccharide complex (TANDEM) 162-115.2 MG CAPS Take 1 capsule by mouth daily at 2 PM daily at 2 PM.  30 capsule  11  . folic acid (FOLVITE) 1 MG tablet Take 1 tablet (1 mg total) by mouth daily.  30 tablet  11  . galantamine (RAZADYNE ER) 8 MG 24 hr capsule Take 1 capsule (8 mg total) by mouth daily with breakfast.  30 capsule  11  . memantine (NAMENDA) 10 MG tablet Take 1 tablet (10 mg total) by mouth 2 (two) times daily.  60 tablet  6  . vitamin D, CHOLECALCIFEROL, 400 UNITS tablet Take 2 tablets (800 Units total) by mouth daily.  60 tablet  11    Allergies  Allergen Reactions  . Ciprofloxacin Hcl     REACTION: ash  . Codeine Sulfate     REACTION: hives  . Divalproex Sodium     REACTION: anemia  . Epinephrine Hcl     REACTION: needs plain lidocaine if local needed  . Penicillins     REACTION: rash and hives  . Prednisone       REACTION: triggers her bipolar  . Sulfamethoxazole W-Trimethoprim     REACTION: hives    Past Medical History  Diagnosis Date  . Anemia   . Alzheimer's disease   . Bipolar affective disorder, manic   . Neuropathy   . History of recurrent UTIs   . Carcinomas, basal cell   . Arthritis     Past Surgical History  Procedure Date  . Right leg plate from fracture 1970s  . Cholecystectomy   . Anemia 8/09    transfused and procrit----Dr Gittin  . Mania     hospitalized twice  . Lumbar laminectomy     Family History  Problem Relation Age of Onset  . Heart disease Daughter     History   Social History  . Marital Status: Widowed    Spouse Name: N/A    Number of Children: 4  . Years of Education: N/A   Occupational History  . 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 Smoking status: Never Smoker   . Smokeless tobacco: Never Used  . Alcohol Use: No  . Drug Use: No  . Sexually Active: Not on file   Other Topics Concern  . Not  on file   Social History Narrative   3 daughters 1 sonDaughter Michele Mendoza--RN or Michele Mendoza have health care POAHas living willDNR in place   Review of Systems Appetite is fair---needs prompting Did lose a few pounds Sleeps okay    Objective:   Physical Exam  Constitutional: She appears well-developed and well-nourished. No distress.  Neck: Normal range of motion. Neck supple. No thyromegaly present.  Cardiovascular: Normal rate, regular rhythm and normal heart sounds.  Exam reveals no gallop.   No murmur heard. Pulmonary/Chest: Effort normal and breath sounds normal. No respiratory distress. She has no wheezes. She has no rales.  Abdominal: Soft. There is no tenderness.  Musculoskeletal: She exhibits no edema and no tenderness.  Lymphadenopathy:    She has no cervical adenopathy.  Neurological:       Slow gait--mild psychomotor depression No apparent tardive dyskinesia  Psychiatric: She has a normal mood and  affect. Her behavior is normal.          Assessment & Plan:

## 2011-09-16 NOTE — Assessment & Plan Note (Signed)
Mild symptoms Off antipsychotic now Symptoms not enough for trial of meds

## 2011-09-16 NOTE — Assessment & Plan Note (Signed)
Pain seems to be controlled with the tylenol

## 2011-09-21 ENCOUNTER — Other Ambulatory Visit: Payer: Self-pay | Admitting: Internal Medicine

## 2011-09-21 NOTE — Telephone Encounter (Signed)
rx sent to pharmacy by [facility]  

## 2011-09-21 NOTE — Telephone Encounter (Signed)
Okay x 1 year 

## 2011-10-05 ENCOUNTER — Other Ambulatory Visit: Payer: Self-pay | Admitting: Internal Medicine

## 2011-10-18 ENCOUNTER — Other Ambulatory Visit: Payer: Self-pay | Admitting: Internal Medicine

## 2011-11-02 DIAGNOSIS — I4891 Unspecified atrial fibrillation: Secondary | ICD-10-CM

## 2011-11-02 HISTORY — DX: Unspecified atrial fibrillation: I48.91

## 2011-11-15 ENCOUNTER — Other Ambulatory Visit: Payer: Self-pay | Admitting: Internal Medicine

## 2011-11-21 ENCOUNTER — Emergency Department: Payer: Self-pay | Admitting: Emergency Medicine

## 2011-11-21 LAB — CBC
HCT: 33.3 % — ABNORMAL LOW (ref 35.0–47.0)
MCH: 32.3 pg (ref 26.0–34.0)
MCHC: 34.7 g/dL (ref 32.0–36.0)
MCV: 93 fL (ref 80–100)
Platelet: 159 10*3/uL (ref 150–440)
RBC: 3.58 10*6/uL — ABNORMAL LOW (ref 3.80–5.20)
RDW: 13.6 % (ref 11.5–14.5)

## 2011-11-21 LAB — COMPREHENSIVE METABOLIC PANEL
Albumin: 3.4 g/dL (ref 3.4–5.0)
Alkaline Phosphatase: 46 U/L — ABNORMAL LOW (ref 50–136)
BUN: 17 mg/dL (ref 7–18)
Bilirubin,Total: 0.2 mg/dL (ref 0.2–1.0)
Chloride: 108 mmol/L — ABNORMAL HIGH (ref 98–107)
Co2: 28 mmol/L (ref 21–32)
Creatinine: 1.16 mg/dL (ref 0.60–1.30)
Glucose: 102 mg/dL — ABNORMAL HIGH (ref 65–99)
SGPT (ALT): 20 U/L (ref 12–78)
Sodium: 143 mmol/L (ref 136–145)
Total Protein: 8.5 g/dL — ABNORMAL HIGH (ref 6.4–8.2)

## 2011-11-21 LAB — TROPONIN I: Troponin-I: 0.04 ng/mL

## 2011-11-21 LAB — CK TOTAL AND CKMB (NOT AT ARMC): CK, Total: 42 U/L (ref 21–215)

## 2011-11-22 ENCOUNTER — Ambulatory Visit (INDEPENDENT_AMBULATORY_CARE_PROVIDER_SITE_OTHER): Payer: Medicare Other | Admitting: Internal Medicine

## 2011-11-22 ENCOUNTER — Encounter: Payer: Self-pay | Admitting: Internal Medicine

## 2011-11-22 VITALS — BP 118/60 | HR 60 | Temp 98.5°F | Wt 142.0 lb

## 2011-11-22 DIAGNOSIS — I4891 Unspecified atrial fibrillation: Secondary | ICD-10-CM

## 2011-11-22 DIAGNOSIS — K117 Disturbances of salivary secretion: Secondary | ICD-10-CM

## 2011-11-22 DIAGNOSIS — R682 Dry mouth, unspecified: Secondary | ICD-10-CM

## 2011-11-22 NOTE — Progress Notes (Signed)
Subjective:    Patient ID: Michele Mendoza, female    DOB: 27-Feb-1928, 78 y.o.   MRN: [id number]  HPI Here with daughter  She went to [facility] to pick her up to take her out at lunch Had been complaining of chest pain BP was okay but heart rate was 140 Had irregular rhythm as well Rescue called and went to [facility]----records requested Atrial fibrillation was confirmed (?some flutter--per ER doctor)--on EMS strips Converted back to sinus rhythm by the time she made it to ER Did EKG and enzymes--all were fine Started on metoprolol 25mg  daily and ASA 81mg   Prior to this--developed bad dyskinesia Moving tongue around Complaints in her throat Seen by Dr Tenna Child she had problem with salivary gland Tried lemon drops to stimulate saliva (since her mouth was dry) Concerned that namenda could be related to both these problems Current Outpatient Prescriptions on File Prior to Visit  Medication Sig Dispense Refill  . acetaminophen (TYLENOL) 650 MG CR tablet Take 1 tablet (650 mg total) by mouth 2 (two) times daily as needed for pain.  60 tablet  11  . Artificial Tear (GENTEAL) GEL Apply 1 drop to eye 3 (three) times daily.  10 mL  11  . Docusate Sodium (COLACE) 150 MG/15ML syrup Take 100 mg by mouth daily.      . ferrous fumarate-iron polysaccharide complex (TANDEM) 162-115.2 MG CAPS Take 1 capsule by mouth daily at 2 PM daily at 2 PM.  30 capsule  11  . folic acid (FOLVITE) 1 MG tablet TAKE ONE TABLET BY MOUTH ONCE DAILY AT 2PM.  30 tablet  11  . metoprolol succinate (TOPROL-XL) 50 MG 24 hr tablet Take 50 mg by mouth daily. Take with or immediately following a meal.      . RAZADYNE ER 8 MG 24 hr capsule TAKE 1 CAPSULE (8MG ) BY MOUTH DAILY WITH BREAKFAST.(DEMENTIA)  30 each  11  . vitamin D, CHOLECALCIFEROL, 400 UNITS tablet Take 2 tablets (800 Units total) by mouth daily.  60 tablet  11    Allergies  Allergen Reactions  . Ciprofloxacin Hcl     REACTION: ash  . Codeine Sulfate    REACTION: hives  . Divalproex Sodium     REACTION: anemia  . Epinephrine Hcl     REACTION: needs plain lidocaine if local needed  . Penicillins     REACTION: rash and hives  . Prednisone     REACTION: triggers her bipolar  . Sulfamethoxazole W-Trimethoprim     REACTION: hives    Past Medical History  Diagnosis Date  . Anemia   . Alzheimer's disease   . Bipolar affective disorder, manic   . Neuropathy   . History of recurrent UTIs   . Carcinomas, basal cell   . Arthritis   . Atrial fibrillation 10/13    Brief paroxysm    Past Surgical History  Procedure Date  . Right leg plate from fracture 1970s  . Cholecystectomy   . Anemia 8/09    transfused and procrit----Dr Gittin  . Mania     hospitalized twice  . Lumbar laminectomy     Family History  Problem Relation Age of Onset  . Heart disease Daughter     History   Social History  . Marital Status: Widowed    Spouse Name: N/A    Number of Children: 4  . Years of Education: N/A   Occupational History  . 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 Smoking status: Never Smoker   . Smokeless tobacco: Never Used  . Alcohol Use: No  . Drug Use: No  . Sexually Active: Not on file   Other Topics Concern  . Not on file   Social History Narrative   3 daughters 1 sonDaughter [name]--RN or [name] have health care POAHas living willDNR in place   Review of Systems Some cough in throat Trouble swallowing Hasn't lost weight of note     Objective:   Physical Exam  Constitutional: She appears well-developed. No distress.  HENT:       Slightly tender salivary gland at angle of right mandible  Neck: Normal range of motion.  Cardiovascular: Regular rhythm and normal heart sounds.  Exam reveals no gallop.   No murmur heard.      HR ~56  Pulmonary/Chest: Effort normal. No respiratory distress. She has no wheezes. She has no rales.  Musculoskeletal: She exhibits no edema and no  tenderness.  Lymphadenopathy:    She has no cervical adenopathy.  Psychiatric:       Calm and cooperative          Assessment & Plan:

## 2011-11-22 NOTE — Assessment & Plan Note (Signed)
First episode of short lived atrial fib Will continue the asa Try lower dose of metoprolol

## 2011-11-22 NOTE — Patient Instructions (Addendum)
We will plan follow up at [facility]

## 2011-11-22 NOTE — Assessment & Plan Note (Signed)
With tender right salivary gland Will stop all the rinses and local Rx Try off the namenda in case this is part of the problem Consider empiric antibiotic if she doesn't improve

## 2011-12-06 ENCOUNTER — Telehealth: Payer: Self-pay

## 2011-12-06 NOTE — Telephone Encounter (Signed)
We can try alprazolam 0.25mg  tid prn  Order written  Please fax

## 2011-12-06 NOTE — Telephone Encounter (Signed)
Kathie Rhodes with [facility] left v/m requesting med for anxiety. Pt complains of chest pain on and off; vital signs are good.Pts daughter is RN with [facility] and wants to know if Dr Alphonsus Sias would prescribe med for anxiety when pt has these episodes. [facility] request callback. Fax rx to [phone].Spoke with Morrie Sheldon med tech at [facility] and she said pt is not complaining of pain today.Please advise.

## 2011-12-07 NOTE — Telephone Encounter (Signed)
Order faxed.

## 2011-12-23 ENCOUNTER — Telehealth: Payer: Self-pay

## 2011-12-23 NOTE — Telephone Encounter (Signed)
pts daughter, Lillia Abed left v/m; pt is at [facility]; pt fell x 2 this AM no apparent injury. Daughter thinks pt back in atrial fib again this morning with chest pain x 1 earlier this morning. Pt taking Metoprolol succinate 25 mg XL daily. SInce heart rate is very irregular this AM; Pulse ranged from 80's to 120 earlier this morning pts daughter wonders if Metoprolol should be increased to 50 mg. Since pt has had breakfast feeling OK now. Also prior to falls this morning pt was favoring lt leg due to arthritis again and some problems walking. pts daugher request PT evaluation to see if pt needs walker. [name] request call back.

## 2011-12-23 NOTE — Telephone Encounter (Signed)
Discussed that I am hesitant to increase metoprolol when she may not be fast all the time and increased dose can cause side effects Can eval in office if persists  Will make referral for PT due to falls and leg weakness Needs assessment for walker also

## 2012-01-17 ENCOUNTER — Ambulatory Visit: Payer: Self-pay | Admitting: Otolaryngology

## 2012-01-31 ENCOUNTER — Telehealth: Payer: Self-pay | Admitting: Internal Medicine

## 2012-01-31 NOTE — Telephone Encounter (Signed)
See note from daughter Michele Mendoza is hospice nurse  Chronic cough, swallowing problems, etc Certainly reflux is likely Will try empiric omeprazole for now Visit there planned 1/16 If not better then, will consider GI referral

## 2012-02-09 ENCOUNTER — Telehealth: Payer: Self-pay | Admitting: *Deleted

## 2012-02-09 NOTE — Telephone Encounter (Signed)
Fax stating that resident is coughing up a thick brown greenish color mucus with a little blood in it. Per written order from Dr.Letvak start doxycycline 100mg  bid x7 days.  Order faxed back to [facility]

## 2012-02-17 ENCOUNTER — Non-Acute Institutional Stay: Payer: Medicare Other | Admitting: Internal Medicine

## 2012-02-17 ENCOUNTER — Encounter: Payer: Self-pay | Admitting: Internal Medicine

## 2012-02-17 VITALS — BP 124/56 | HR 56 | Temp 99.3°F | Resp 20 | Wt 135.0 lb

## 2012-02-17 DIAGNOSIS — I4891 Unspecified atrial fibrillation: Secondary | ICD-10-CM

## 2012-02-17 DIAGNOSIS — G309 Alzheimer's disease, unspecified: Secondary | ICD-10-CM

## 2012-02-17 DIAGNOSIS — F0280 Dementia in other diseases classified elsewhere without behavioral disturbance: Secondary | ICD-10-CM

## 2012-02-17 DIAGNOSIS — K219 Gastro-esophageal reflux disease without esophagitis: Secondary | ICD-10-CM

## 2012-02-17 DIAGNOSIS — F319 Bipolar disorder, unspecified: Secondary | ICD-10-CM

## 2012-02-17 NOTE — Assessment & Plan Note (Addendum)
Has had some cough with eating and throat complaints May be some better on the omprazole Will try increasing to bid

## 2012-02-17 NOTE — Assessment & Plan Note (Signed)
Probably some progression Seems less interactive Not eating as well---seems to have an issue with texture now Will try off the razadyne

## 2012-02-17 NOTE — Assessment & Plan Note (Signed)
Seems to be okay No mood instability Off lithium and now the seroquel

## 2012-02-17 NOTE — Progress Notes (Signed)
Subjective:    Patient ID: Michele Mendoza, female    DOB: Jun 10, 1928, 79 y.o.   MRN: [id number]  HPI Reviewed status with [name] the unit coordinator Daughter [name] is here  Seems to be walking better Had physical therapy  Has had ongoing throat discomfort Moves her tongue around mouth---seems to be dry Sucks on her tongue---- ?tardive dyskinesia Now off the seroquel because of this concern On omeprazole----does seem to have symptoms with acidic liquids, etc Saw Dr Andee Poles twice. Had MRI. No diagnosis Coughs with eating--may be some better with the omeprazole  Mood has been stable No obvious depression Ongoing confusion---unable to answer any questions with clarity  Had spell of atrial fib Associated with rapid atrial fib and dizziness Still on the metoprolol Has had some brief chest pain Daughter not interested in cardiology eval unless things deteriorate Current Outpatient Prescriptions on File Prior to Visit  Medication Sig Dispense Refill  . Artificial Tear (GENTEAL) GEL Apply 1 drop to eye 3 (three) times daily.  10 mL  11  . Docusate Sodium (COLACE) 150 MG/15ML syrup Take 100 mg by mouth daily.      Marland Kitchen doxycycline (VIBRAMYCIN) 100 MG capsule Take 100 mg by mouth 2 (two) times daily.      . metoprolol succinate (TOPROL-XL) 50 MG 24 hr tablet Take 25 mg by mouth daily. Take with or immediately following a meal.      . omeprazole (PRILOSEC) 20 MG capsule Take 20 mg by mouth at bedtime.      Marland Kitchen RAZADYNE ER 8 MG 24 hr capsule TAKE 1 CAPSULE (8MG ) BY MOUTH DAILY WITH BREAKFAST.(DEMENTIA)  30 each  11  . vitamin D, CHOLECALCIFEROL, 400 UNITS tablet Take 2 tablets (800 Units total) by mouth daily.  60 tablet  11    Allergies  Allergen Reactions  . Ciprofloxacin Hcl     REACTION: ash  . Codeine Sulfate     REACTION: hives  . Divalproex Sodium     REACTION: anemia  . Epinephrine Hcl     REACTION: needs plain lidocaine if local needed  . Penicillins     REACTION: rash and  hives  . Prednisone     REACTION: triggers her bipolar  . Sulfamethoxazole W-Trimethoprim     REACTION: hives    Past Medical History  Diagnosis Date  . Anemia   . Alzheimer's disease   . Bipolar affective disorder, manic   . Neuropathy   . History of recurrent UTIs   . Carcinomas, basal cell   . Arthritis   . Atrial fibrillation 10/13    Brief paroxysm  . GERD (gastroesophageal reflux disease)     Past Surgical History  Procedure Date  . Right leg plate from fracture 1970s  . Cholecystectomy   . Anemia 8/09    transfused and procrit----Dr Gittin  . Mania     hospitalized twice  . Lumbar laminectomy     Family History  Problem Relation Age of Onset  . Heart disease Daughter     History   Social History  . Marital Status: Widowed    Spouse Name: N/A    Number of Children: 4  . Years of Education: N/A   Occupational History  . 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 Smoking status: Never Smoker   . Smokeless tobacco: Never Used  . Alcohol Use: No  . Drug Use: No  . Sexually Active: Not on  file   Other Topics Concern  . Not on file   Social History Narrative   3 daughters 1 sonDaughter [name]--RN or [name] have health care POAHas living willDNR in place   Review of Systems Appetite seems to have decreased Lost weight since last visit but back to her usual baseline Does okay with softer foods Sleeps okay though she is staying up later at times. Does okay once she initiates    Objective:   Physical Exam  Constitutional: She appears well-developed and well-nourished. No distress.  Neck: Normal range of motion. Neck supple. No thyromegaly present.  Cardiovascular: Normal rate, regular rhythm and normal heart sounds.  Exam reveals no gallop.   No murmur heard. Pulmonary/Chest: Effort normal and breath sounds normal. No respiratory distress. She has no wheezes. She has no rales.  Abdominal: Soft. There is no tenderness.    Musculoskeletal: She exhibits no edema and no tenderness.  Lymphadenopathy:    She has no cervical adenopathy.  Neurological:       Walks fairly stably No tremor  Psychiatric:       Passive but cooperative Does answer questions Somewhat flat but not depressed          Assessment & Plan:

## 2012-02-17 NOTE — Assessment & Plan Note (Signed)
Regular again Has probably had some paroxysms Continues on the metoprolol Will restart the ASA ---not sure how it was stopped High risk for falls so no coumadin

## 2012-05-11 ENCOUNTER — Telehealth: Payer: Self-pay

## 2012-05-11 NOTE — Telephone Encounter (Signed)
Order faxed back.

## 2012-05-11 NOTE — Telephone Encounter (Signed)
Order written. Please fax

## 2012-05-11 NOTE — Telephone Encounter (Signed)
Michele Mendoza [employer] of [address] left v/m; pt incontinent of urine more frequently, not sure if decline in pt status but pts daughter concerned could be UTI; Michele Mendoza request order faxed to [facility] to ck for UTI.Please advise.

## 2012-05-12 ENCOUNTER — Ambulatory Visit (INDEPENDENT_AMBULATORY_CARE_PROVIDER_SITE_OTHER): Payer: Medicare Other | Admitting: Internal Medicine

## 2012-05-12 ENCOUNTER — Encounter: Payer: Self-pay | Admitting: Internal Medicine

## 2012-05-12 VITALS — BP 112/68 | HR 64 | Temp 98.5°F | Wt 138.0 lb

## 2012-05-12 DIAGNOSIS — F0280 Dementia in other diseases classified elsewhere without behavioral disturbance: Secondary | ICD-10-CM

## 2012-05-12 DIAGNOSIS — R350 Frequency of micturition: Secondary | ICD-10-CM

## 2012-05-12 DIAGNOSIS — R269 Unspecified abnormalities of gait and mobility: Secondary | ICD-10-CM

## 2012-05-12 DIAGNOSIS — G20 Parkinson's disease: Secondary | ICD-10-CM

## 2012-05-12 DIAGNOSIS — R2681 Unsteadiness on feet: Secondary | ICD-10-CM | POA: Insufficient documentation

## 2012-05-12 LAB — BASIC METABOLIC PANEL
BUN: 23 mg/dL (ref 6–23)
CO2: 24 mEq/L (ref 19–32)
Calcium: 9 mg/dL (ref 8.4–10.5)
Creatinine, Ser: 1.4 mg/dL — ABNORMAL HIGH (ref 0.4–1.2)
Glucose, Bld: 99 mg/dL (ref 70–99)

## 2012-05-12 LAB — POCT URINALYSIS DIPSTICK
Ketones, UA: NEGATIVE
Protein, UA: NEGATIVE
Spec Grav, UA: 1.01
Urobilinogen, UA: NEGATIVE
pH, UA: 6

## 2012-05-12 LAB — CBC WITH DIFFERENTIAL/PLATELET
Basophils Absolute: 0 10*3/uL (ref 0.0–0.1)
Eosinophils Absolute: 0.1 10*3/uL (ref 0.0–0.7)
Hemoglobin: 10.2 g/dL — ABNORMAL LOW (ref 12.0–15.0)
Lymphocytes Relative: 17.2 % (ref 12.0–46.0)
MCHC: 34.7 g/dL (ref 30.0–36.0)
Neutro Abs: 4.8 10*3/uL (ref 1.4–7.7)
Neutrophils Relative %: 72.1 % (ref 43.0–77.0)
Platelets: 180 10*3/uL (ref 150.0–400.0)
RDW: 13.4 % (ref 11.5–14.6)

## 2012-05-12 LAB — HEPATIC FUNCTION PANEL
Albumin: 3.5 g/dL (ref 3.5–5.2)
Alkaline Phosphatase: 40 U/L (ref 39–117)
Bilirubin, Direct: 0 mg/dL (ref 0.0–0.3)
Total Bilirubin: 0.2 mg/dL — ABNORMAL LOW (ref 0.3–1.2)

## 2012-05-12 LAB — TSH: TSH: 0.41 u[IU]/mL (ref 0.35–5.50)

## 2012-05-12 MED ORDER — CARBIDOPA-LEVODOPA 10-100 MG PO TABS: 1.0000 | ORAL_TABLET | Freq: Two times a day (BID) | ORAL | Status: AC

## 2012-05-12 NOTE — Assessment & Plan Note (Signed)
Not classic Parkinsons Just had PT No focal weakness Will try sinemet

## 2012-05-12 NOTE — Progress Notes (Signed)
Subjective:    Patient ID: Michele Mendoza, female    DOB: [birth year], 79 y.o.   MRN: [id number]  HPI Here with daughter States "I am not feeling great" Still having bad trouble with throat Coughs, like with mucous, when eating No apparent aspiration  More gait disturbance  Tends to lean backward No longer writes 2 recent falls  Still has resting tremor No apparent trouble using utensils  Current Outpatient Prescriptions on File Prior to Visit  Medication Sig Dispense Refill  . acetaminophen (TYLENOL) 650 MG CR tablet Take 650 mg by mouth 2 (two) times daily. And bid prn      . ALPRAZolam (XANAX) 0.25 MG tablet Take 0.25 mg by mouth 3 (three) times daily as needed.      . Artificial Tear (GENTEAL) GEL Apply 1 drop to eye 3 (three) times daily.  10 mL  11  . aspirin 81 MG tablet Take 81 mg by mouth daily.      Tery Sanfilippo Sodium (COLACE) 150 MG/15ML syrup Take 100 mg by mouth daily.      Marland Kitchen doxycycline (VIBRAMYCIN) 100 MG capsule Take 100 mg by mouth 2 (two) times daily.      Marland Kitchen ibuprofen (ADVIL,MOTRIN) 400 MG tablet Take 400 mg by mouth every 8 (eight) hours as needed. Neck pain      . omeprazole (PRILOSEC) 20 MG capsule Take 20 mg by mouth 2 (two) times daily.       . vitamin D, CHOLECALCIFEROL, 400 UNITS tablet Take 2 tablets (800 Units total) by mouth daily.  60 tablet  11   No current facility-administered medications on file prior to visit.    Allergies  Allergen Reactions  . Ciprofloxacin Hcl     REACTION: ash  . Codeine Sulfate     REACTION: hives  . Divalproex Sodium     REACTION: anemia  . Epinephrine Hcl     REACTION: needs plain lidocaine if local needed  . Penicillins     REACTION: rash and hives  . Prednisone     REACTION: triggers her bipolar  . Sulfamethoxazole W-Trimethoprim     REACTION: hives    Past Medical History  Diagnosis Date  . Anemia   . Alzheimer's disease   . Bipolar affective disorder, manic   . Neuropathy   . History of recurrent UTIs    . Carcinomas, basal cell   . Arthritis   . Atrial fibrillation 10/13    Brief paroxysm  . GERD (gastroesophageal reflux disease)     Past Surgical History  Procedure Laterality Date  . Right leg plate from fracture  1970s  . Cholecystectomy    . Anemia  8/09    transfused and procrit----Dr Gittin  . Mania      hospitalized twice  . Lumbar laminectomy      Family History  Problem Relation Age of Onset  . Heart disease Daughter     History   Social History  . Marital Status: Widowed    Spouse Name: N/A    Number of Children: 4  . Years of Education: N/A   Occupational History  . 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 Smoking status: Never Smoker   . Smokeless tobacco: Never Used  . Alcohol Use: No  . Drug Use: No  . Sexually Active: Not on file   Other Topics Concern  . Not on file   Social History Narrative  3 daughters    1 son   Daughter [name]--RN or [name] have health care POA   Has living will   DNR in place   Review of Systems Having some hip pain--right Just finished PT Appetite is okay    Objective:   Physical Exam  Constitutional: She appears well-developed and well-nourished. No distress.  Musculoskeletal:  Right hip slightly externally rotated with walking No localized tenderness--even with passive rotation  Neurological:  Some bradykinesia Resting tremor some better with action (hands) Gait is slow but not shuffling Slight stiffness  Psychiatric:  Seems somewhat distant--looks off in the distance Follows commands well          Assessment & Plan:

## 2012-05-12 NOTE — Assessment & Plan Note (Signed)
Urinalysis is only borderline Will check culture just in case but I doubt UTI May have increased frequency due to increased fluids for her throat symptoms

## 2012-05-12 NOTE — Assessment & Plan Note (Signed)
Tremor Gait instability Could this be causing the throat symptoms? Will try low dose sinemet

## 2012-05-14 LAB — URINE CULTURE

## 2012-05-15 ENCOUNTER — Telehealth: Payer: Self-pay | Admitting: Internal Medicine

## 2012-05-15 NOTE — Telephone Encounter (Signed)
Discussed possibilities with daughter Not much pyuria but could be UTI with positive culture Will try 7 days of keflex (PCN allergy but no anaphylaxis)  ??NPH---- not excited about looking for this due to lack of expectation a shunt would help much ([name]'s dad had this so she is familiar)  Will await the response to antibiotic and sinemet

## 2012-05-15 NOTE — Telephone Encounter (Signed)
Caller: Michele Mendoza; Phone: [phone]; Reason for Call: Daughter is calling to find out the results of the Lab Work that Dr.  Alphonsus Sias did at her Mother's appointment on Friday 05/12/12.  She also wants Dr.  Alphonsus Sias to know that 1) Her mother has a large amount of bruises today that were not evident on Friday after her fall.  2) Her mother is having increased difficulty walking that she does not believe is a side effect of the Sinemet IR (10 -100 mg) since she has only been on if for a few days.  3) Her mother is having is still having some dizziness when she first stands up, but her vitals have been stable : BP: 160/82 (sitting, but did not decrease when she stood up) and HR: 76-84.  No new Fever.  4) Her mother is still having increased urination.  She also wanted to remind Dr.  Alphonsus Sias that her mother did hit her head, but she is not had any vomiting or neurological symptoms.  Please call her with the results of the Blood Work and if there is anything else Dr.  Alphonsus Sias thinks needs to be done for her mother at this time.

## 2012-05-16 ENCOUNTER — Encounter: Payer: Self-pay | Admitting: *Deleted

## 2012-06-07 ENCOUNTER — Other Ambulatory Visit: Payer: Self-pay | Admitting: Internal Medicine

## 2012-07-06 ENCOUNTER — Non-Acute Institutional Stay: Payer: Medicare Other | Admitting: Internal Medicine

## 2012-07-06 ENCOUNTER — Encounter: Payer: Self-pay | Admitting: Internal Medicine

## 2012-07-06 VITALS — BP 116/60 | HR 56 | Temp 97.2°F | Resp 20 | Wt 140.0 lb

## 2012-07-06 DIAGNOSIS — G20 Parkinson's disease: Secondary | ICD-10-CM

## 2012-07-06 DIAGNOSIS — F319 Bipolar disorder, unspecified: Secondary | ICD-10-CM

## 2012-07-06 DIAGNOSIS — K117 Disturbances of salivary secretion: Secondary | ICD-10-CM

## 2012-07-06 DIAGNOSIS — F0280 Dementia in other diseases classified elsewhere without behavioral disturbance: Secondary | ICD-10-CM

## 2012-07-06 DIAGNOSIS — R682 Dry mouth, unspecified: Secondary | ICD-10-CM

## 2012-07-06 DIAGNOSIS — I4891 Unspecified atrial fibrillation: Secondary | ICD-10-CM

## 2012-07-06 DIAGNOSIS — G309 Alzheimer's disease, unspecified: Secondary | ICD-10-CM

## 2012-07-06 NOTE — Progress Notes (Signed)
Subjective:    Patient ID: Michele Mendoza, female    DOB: Jan 01, 1929, 79 y.o.   MRN: [id number]  HPI Here with daughters Michele Mendoza and Michele Mendoza Reviewed status with Michele Mendoza--- no new concerns  Seems to have don e well with the sinemet No more falls Tremors are better  Still has hip pain Tylenol seems to help some Usually doesn't get the ibuprofen  Still has dry throat and throat symptoms Has had full ENT evaluation without any abnormal findings Has abnormal throat movements-- ?tardive dyskinesia (but is swallowing)  No apparent heart symptoms No palpitations No chest pain or SOB No obvious dizziness  Mood has been good Really upbeat in AM---tends to get "testy" as the day goes on No manic or hypomanic type spells  Current Outpatient Prescriptions on File Prior to Visit  Medication Sig Dispense Refill  . ALPRAZolam (XANAX) 0.25 MG tablet Take 0.25 mg by mouth 3 (three) times daily as needed.      Marland Kitchen antiseptic oral rinse (BIOTENE) LIQD 15 mLs by Mouth Rinse route 3 (three) times daily.      . Artificial Tear (GENTEAL) GEL Apply 1 drop to eye 3 (three) times daily.  10 mL  11  . aspirin 81 MG tablet Take 81 mg by mouth daily.      . carbidopa-levodopa (SINEMET IR) 10-100 MG per tablet Take 1 tablet by mouth 2 (two) times daily.  60 tablet  11  . Docusate Sodium (COLACE) 150 MG/15ML syrup Take 100 mg by mouth daily.      Marland Kitchen ibuprofen (ADVIL,MOTRIN) 400 MG tablet Take 400 mg by mouth every 8 (eight) hours as needed. Neck pain      . metoprolol succinate (TOPROL-XL) 25 MG 24 hr tablet Take 25 mg by mouth daily.      . QC ARTHRITIS PAIN RELIEF 650 MG CR tablet TAKE (1) TABLET BY MOUTH TWICE A DAY AS NEEDED FOR PAIN.  60 tablet  11  . sodium fluoride (PREVIDENT) 1.1 % GEL dental gel Place 1 application onto teeth at bedtime.      . vitamin D, CHOLECALCIFEROL, 400 UNITS tablet Take 2 tablets (800 Units total) by mouth daily.  60 tablet  11   No current facility-administered medications on  file prior to visit.    Allergies  Allergen Reactions  . Ciprofloxacin Hcl     REACTION: ash  . Codeine Sulfate     REACTION: hives  . Divalproex Sodium     REACTION: anemia  . Epinephrine Hcl     REACTION: needs plain lidocaine if local needed  . Penicillins     REACTION: rash and hives  . Prednisone     REACTION: triggers her bipolar  . Sulfamethoxazole W-Trimethoprim     REACTION: hives    Past Medical History  Diagnosis Date  . Anemia   . Alzheimer's disease   . Bipolar affective disorder, manic   . Neuropathy   . History of recurrent UTIs   . Carcinomas, basal cell   . Arthritis   . Atrial fibrillation 10/13    Brief paroxysm  . GERD (gastroesophageal reflux disease)     Past Surgical History  Procedure Laterality Date  . Right leg plate from fracture  1970s  . Cholecystectomy    . Anemia  8/09    transfused and procrit----Dr Gittin  . Mania      hospitalized twice  . Lumbar laminectomy      Family History  Problem Relation Age of Onset  .  Heart disease Daughter     History   Social History  . Marital Status: Widowed    Spouse Name: N/A    Number of Children: 4  . Years of Education: N/A   Occupational History  . 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 Smoking status: Never Smoker   . Smokeless tobacco: Never Used  . Alcohol Use: No  . Drug Use: No  . Sexually Active: Not on file   Other Topics Concern  . Not on file   Social History Narrative   3 daughters    1 son   Daughter [name]--RN or Michele Mendoza have health care POA   Has living will   DNR in place   Review of Systems Appetite is good Coughs when eating but no clear aspiration events Sleeps okay apparently    Objective:   Physical Exam  Constitutional: She appears well-developed and well-nourished. No distress.  Neck: Normal range of motion. Neck supple. No thyromegaly present.  Cardiovascular: Normal rate, regular rhythm and normal heart  sounds.  Exam reveals no gallop.   No murmur heard. Pulmonary/Chest: Effort normal and breath sounds normal. No respiratory distress. She has no wheezes. She has no rales.  Abdominal: Soft. There is no tenderness.  Musculoskeletal: She exhibits no edema and no tenderness.  Lymphadenopathy:    She has no cervical adenopathy.  Neurological:  Walks better-- not shuffling. More stable Very slight resting tremor in hands Normal tone  Psychiatric: She has a normal mood and affect. Her behavior is normal.  Some smiles Normal interaction for her          Assessment & Plan:

## 2012-07-06 NOTE — Assessment & Plan Note (Signed)
No answers from full ENT evaluation Has movements also (?tardive dyskinesia) --but these are intermittent Using biotene

## 2012-07-06 NOTE — Assessment & Plan Note (Signed)
Still seems to be in sinus rhythm on the metoprolol

## 2012-07-06 NOTE — Assessment & Plan Note (Signed)
Clearly better with low dose sinemet Not clear if this is Parkinson's disease or side effects from long term antipsychotic use

## 2012-07-06 NOTE — Assessment & Plan Note (Signed)
Seems to be in remission without Rx  Observation only

## 2012-07-06 NOTE — Assessment & Plan Note (Signed)
Still mild and doesn't seem to have progressed No Rx

## 2012-11-01 ENCOUNTER — Other Ambulatory Visit: Payer: Self-pay | Admitting: *Deleted

## 2012-11-01 NOTE — Telephone Encounter (Signed)
Last filled 12/07/2011

## 2012-11-01 NOTE — Telephone Encounter (Signed)
Okay #[phone] x 0 

## 2012-11-02 ENCOUNTER — Other Ambulatory Visit: Payer: Self-pay | Admitting: *Deleted

## 2012-11-02 MED ORDER — ALPRAZOLAM 0.25 MG PO TABS: 0.2500 mg | ORAL_TABLET | Freq: Three times a day (TID) | ORAL | Status: DC | PRN

## 2012-11-02 NOTE — Telephone Encounter (Signed)
rx called into pharmacy

## 2012-11-02 NOTE — Telephone Encounter (Signed)
Duplicate request

## 2012-11-02 NOTE — Telephone Encounter (Signed)
Last filled 12/07/11

## 2012-11-30 ENCOUNTER — Encounter: Payer: Self-pay | Admitting: Internal Medicine

## 2012-11-30 ENCOUNTER — Non-Acute Institutional Stay: Payer: Medicare Other | Admitting: Internal Medicine

## 2012-11-30 VITALS — BP 140/74 | HR 72 | Temp 97.6°F | Resp 20 | Wt 145.0 lb

## 2012-11-30 DIAGNOSIS — R682 Dry mouth, unspecified: Secondary | ICD-10-CM

## 2012-11-30 DIAGNOSIS — M1990 Unspecified osteoarthritis, unspecified site: Secondary | ICD-10-CM

## 2012-11-30 DIAGNOSIS — F0280 Dementia in other diseases classified elsewhere without behavioral disturbance: Secondary | ICD-10-CM

## 2012-11-30 DIAGNOSIS — I4891 Unspecified atrial fibrillation: Secondary | ICD-10-CM

## 2012-11-30 DIAGNOSIS — D509 Iron deficiency anemia, unspecified: Secondary | ICD-10-CM

## 2012-11-30 DIAGNOSIS — F319 Bipolar disorder, unspecified: Secondary | ICD-10-CM

## 2012-11-30 DIAGNOSIS — G20 Parkinson's disease: Secondary | ICD-10-CM

## 2012-11-30 DIAGNOSIS — K117 Disturbances of salivary secretion: Secondary | ICD-10-CM

## 2012-11-30 NOTE — Assessment & Plan Note (Signed)
Rare tylenol and no ibuprofen recently

## 2012-11-30 NOTE — Assessment & Plan Note (Signed)
Moderate but stable Modest ADL assistance ---still does some of it herself (with supervision) No mood issues

## 2012-11-30 NOTE — Assessment & Plan Note (Signed)
No mood issues No mania or hypomania Off meds

## 2012-11-30 NOTE — Assessment & Plan Note (Signed)
Better on the sinemet Walks with ataxia and fair pace No recent falls

## 2012-11-30 NOTE — Assessment & Plan Note (Signed)
No symptoms No reason to follow closely

## 2012-11-30 NOTE — Progress Notes (Signed)
Subjective:    Patient ID: Michele Mendoza, female    DOB: August 18, 1928, 79 y.o.   MRN: [id number]  HPI Seen with daughter Michele Mendoza Reviewed status with Michele Mendoza the unit coordinator Has generally been stable  Still has trouble with dry mouth No eye problems Swallows okay  No change in cognition Interacts fairly well Supervision with dressing with some hands on --also in shower. Has had some increase in daytime incontinence of urine--- and always at night Continent of bowels Still engages some socially  Moving around well Uses rolling walker and walks with fair pace Still off some with balance and some trouble getting up and down No recent falls  No palpitations No chest pain No dyspnea or sig edema  No depression at all No mania or changes in mood despite stopping all meds  Current Outpatient Prescriptions on File Prior to Visit  Medication Sig Dispense Refill  . ALPRAZolam (XANAX) 0.25 MG tablet Take 1 tablet (0.25 mg total) by mouth 3 (three) times daily as needed.  30 tablet  0  . antiseptic oral rinse (BIOTENE) LIQD 15 mLs by Mouth Rinse route 3 (three) times daily.      . Artificial Tear (GENTEAL) GEL Apply 1 drop to eye 3 (three) times daily.  10 mL  11  . aspirin 81 MG tablet Take 81 mg by mouth daily.      . carbidopa-levodopa (SINEMET IR) 10-100 MG per tablet Take 1 tablet by mouth 2 (two) times daily.  60 tablet  11  . Docusate Sodium (COLACE) 150 MG/15ML syrup Take 100 mg by mouth daily.      Marland Kitchen ibuprofen (ADVIL,MOTRIN) 400 MG tablet Take 400 mg by mouth every 8 (eight) hours as needed. Neck pain      . metoprolol succinate (TOPROL-XL) 25 MG 24 hr tablet Take 25 mg by mouth daily.      . QC ARTHRITIS PAIN RELIEF 650 MG CR tablet TAKE (1) TABLET BY MOUTH TWICE A DAY AS NEEDED FOR PAIN.  60 tablet  11  . sodium fluoride (PREVIDENT) 1.1 % GEL dental gel Place 1 application onto teeth at bedtime.      . vitamin D, CHOLECALCIFEROL, 400 UNITS tablet Take 2 tablets (800 Units  total) by mouth daily.  60 tablet  11   No current facility-administered medications on file prior to visit.    Allergies  Allergen Reactions  . Ciprofloxacin Hcl     REACTION: ash  . Codeine Sulfate     REACTION: hives  . Divalproex Sodium     REACTION: anemia  . Epinephrine Hcl     REACTION: needs plain lidocaine if local needed  . Penicillins     REACTION: rash and hives  . Prednisone     REACTION: triggers her bipolar  . Sulfamethoxazole-Trimethoprim     REACTION: hives    Past Medical History  Diagnosis Date  . Anemia   . Alzheimer's disease   . Bipolar affective disorder, manic   . Neuropathy   . History of recurrent UTIs   . Carcinomas, basal cell   . Arthritis   . Atrial fibrillation 10/13    Brief paroxysm  . GERD (gastroesophageal reflux disease)     Past Surgical History  Procedure Laterality Date  . Right leg plate from fracture  1970s  . Cholecystectomy    . Anemia  8/09    transfused and procrit----Dr Gittin  . Mania      hospitalized twice  . Lumbar laminectomy  Family History  Problem Relation Age of Onset  . Heart disease Daughter     History   Social History  . Marital Status: Widowed    Spouse Name: N/A    Number of Children: 4  . Years of Education: N/A   Occupational History  . 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 Smoking status: Never Smoker   . Smokeless tobacco: Never Used  . Alcohol Use: No  . Drug Use: No  . Sexual Activity: Not on file   Other Topics Concern  . Not on file   Social History Narrative   3 daughters    1 son   Daughter [name]--RN or [name] have health care POA   Has living will   DNR in place   Review of Systems Appetite is good Weight is up a few pounds Bowels have been okay Doesn't seem  Goes out with daughter---family events, out for lunch. Doesn't know who people are but enjoys being there No longer has the abnormal mouth movements    Objective:    Physical Exam  Constitutional: She appears well-developed and well-nourished. No distress.  Neck: Normal range of motion. Neck supple. No thyromegaly present.  Cardiovascular: Normal rate, regular rhythm and normal heart sounds.  Exam reveals no gallop.   No murmur heard. Pulmonary/Chest: Effort normal and breath sounds normal. No respiratory distress. She has no wheezes. She has no rales.  Abdominal: Soft. There is no tenderness.  Musculoskeletal: She exhibits no edema and no tenderness.  Lymphadenopathy:    She has no cervical adenopathy.  Neurological:  Engages socially and verbally----but easily distracted  Skin: No rash noted.  Psychiatric: She has a normal mood and affect. Her behavior is normal.          Assessment & Plan:

## 2012-11-30 NOTE — Assessment & Plan Note (Signed)
Still bothered by this but is helped by the biotene Movements concerning for tardive dyskinesia seem to be gone

## 2012-11-30 NOTE — Assessment & Plan Note (Signed)
No apparent paroxysms while on metoprolol On ASA

## 2013-01-22 ENCOUNTER — Telehealth: Payer: Self-pay | Admitting: Internal Medicine

## 2013-01-22 NOTE — Telephone Encounter (Signed)
Had a bad day at daughter's house yesterday Had syncopal attack  Also tremendous incontinent urine in bed the night before Then did well in shower but started to get trembly and then said something, and went out completely Able to lower her to floor---some brief rigidity Unresponsive for ~1 minute. Then came around  Later she was sitting in front of mirror and had the same feeling They were able to sit her down before they passed out  Daughter held the metoprolol and the carbidopa I told her okay to restart the carbidopa but will stop the metoprolol Orders written to be faxed  to [facility]

## 2013-01-22 NOTE — Telephone Encounter (Signed)
Order faxed and scanned.

## 2013-01-22 NOTE — Telephone Encounter (Signed)
Pt's daughter is calling for her mother and would like for you to call her to discuss patient's medication.  Pt is a resident at the cottage at [facility].   Pt passed out Sunday morning at 8:00 a.m. & almost again at 9:00 a.m.  Pt's daughter says Diamantina Monks told her pt did this once last week, as well.  Daughter would like to discuss medications w/you.  Could you please call her? Thank you.

## 2013-04-04 ENCOUNTER — Telehealth: Payer: Self-pay | Admitting: Internal Medicine

## 2013-04-04 NOTE — Telephone Encounter (Signed)
Pt currently is on a no crush tylenol but Michele Mendoza is saying all her meds need to be crushed because she will not take her meds or can not swallow a whole pill. Please advise.

## 2013-04-04 NOTE — Telephone Encounter (Signed)
Order faxed.

## 2013-04-04 NOTE — Telephone Encounter (Signed)
Orders to change to immediate release written Please fax

## 2013-04-05 ENCOUNTER — Telehealth: Payer: Self-pay | Admitting: Internal Medicine

## 2013-04-05 ENCOUNTER — Ambulatory Visit: Payer: Medicare Other | Admitting: Internal Medicine

## 2013-04-05 MED ORDER — METOPROLOL SUCCINATE ER 25 MG PO TB24: 25.0000 mg | ORAL_TABLET | Freq: Every day | ORAL | Status: DC

## 2013-04-05 NOTE — Telephone Encounter (Signed)
Patient's mother passed out and was unresponsive yesterday. She has a cough,fever.  She has been taking Tylenol for the fever.  Her heart rate has been irregular.  Patient's heart rate has come down today.  Patient's daughter is worried about the Tachycardia and passing out.  Lelan Pons is asking for you to call her and discuss this.

## 2013-04-05 NOTE — Addendum Note (Signed)
Addended by: Viviana Simpler I on: 04/05/2013 01:18 PM   Modules accepted: Orders

## 2013-04-05 NOTE — Telephone Encounter (Signed)
Discussed with [name]--daughter as well as Jackelyn Poling and Joycelyn Schmid at [employer] restart metoprolol for apparent atrial fib recurrence  Hospice referral done due to worsened functional status and instability Falls and synocpe  Morey Hummingbird, Please send demographics, this note and my last note from [place] to [employer] at [place]

## 2013-04-10 ENCOUNTER — Encounter: Payer: Self-pay | Admitting: Internal Medicine

## 2013-04-12 ENCOUNTER — Telehealth: Payer: Self-pay

## 2013-04-12 NOTE — Telephone Encounter (Signed)
I am not familiar with this patient.  I do not see a recent urine culture.  Is she having UTI symptoms? If macrobid was stopped by Dr. Silvio Pate, I would not restart it and will route this for him to address on his return.

## 2013-04-12 NOTE — Telephone Encounter (Signed)
[place] left v/m; Michele Mendoza received faxed physician care update form but Michele Mendoza needs to know what antibiotic pt needs to be on with instructions and how long pt needs to be on antibiotic. Macrobid was stopped on 04/10/13; does Dr Deborra Medina want to continue Macrobid and if so how long. Michele Mendoza request cb.

## 2013-04-13 NOTE — Telephone Encounter (Signed)
Spoke to Abby at [facility] and informed her per Dr Deborra Medina.

## 2013-04-14 NOTE — Telephone Encounter (Signed)
I think she had repeat labs via hospice and must not have been scanned in yet I don't recall there being evidence of a UTI

## 2013-04-16 ENCOUNTER — Other Ambulatory Visit: Payer: Self-pay | Admitting: Internal Medicine

## 2013-04-16 ENCOUNTER — Encounter: Payer: Self-pay | Admitting: Internal Medicine

## 2013-04-16 DIAGNOSIS — F0280 Dementia in other diseases classified elsewhere without behavioral disturbance: Secondary | ICD-10-CM

## 2013-04-16 DIAGNOSIS — I499 Cardiac arrhythmia, unspecified: Secondary | ICD-10-CM

## 2013-04-16 DIAGNOSIS — G20 Parkinson's disease: Secondary | ICD-10-CM

## 2013-04-16 DIAGNOSIS — G309 Alzheimer's disease, unspecified: Secondary | ICD-10-CM

## 2013-04-16 NOTE — Telephone Encounter (Signed)
Left message on voice mail  to call back

## 2013-04-16 NOTE — Telephone Encounter (Signed)
Abby at [facility] notified as instructed by telephone. Was advised by Abby that she is going to fax another copy of the lab work to Dr. Silvio Pate.

## 2013-04-16 NOTE — Telephone Encounter (Signed)
Advised Dr. Silvio Pate that he had received a copy of the lab work from [place]. Dr. Silvio Pate aware and will see patient Thursday.

## 2013-04-19 ENCOUNTER — Non-Acute Institutional Stay: Payer: Medicare Other | Admitting: Internal Medicine

## 2013-04-19 ENCOUNTER — Encounter: Payer: Self-pay | Admitting: Internal Medicine

## 2013-04-19 VITALS — BP 112/54 | HR 62 | Temp 97.8°F | Resp 20 | Wt 150.0 lb

## 2013-04-19 DIAGNOSIS — G20 Parkinson's disease: Secondary | ICD-10-CM

## 2013-04-19 DIAGNOSIS — D509 Iron deficiency anemia, unspecified: Secondary | ICD-10-CM

## 2013-04-19 DIAGNOSIS — G309 Alzheimer's disease, unspecified: Secondary | ICD-10-CM

## 2013-04-19 DIAGNOSIS — F0280 Dementia in other diseases classified elsewhere without behavioral disturbance: Secondary | ICD-10-CM

## 2013-04-19 DIAGNOSIS — R682 Dry mouth, unspecified: Secondary | ICD-10-CM

## 2013-04-19 DIAGNOSIS — I499 Cardiac arrhythmia, unspecified: Secondary | ICD-10-CM

## 2013-04-19 DIAGNOSIS — F319 Bipolar disorder, unspecified: Secondary | ICD-10-CM

## 2013-04-19 DIAGNOSIS — I4891 Unspecified atrial fibrillation: Secondary | ICD-10-CM

## 2013-04-19 NOTE — Assessment & Plan Note (Signed)
Has had syncopal spells with apparent tachycardia Not clear if could be ventricular tach or recurrence of atrial fib ?hypotension related to Parkinsonism Now on hospice care Rate not fast since on metoprolol

## 2013-04-19 NOTE — Assessment & Plan Note (Signed)
Likely recurrence but regular now with metoprolol ASA only due to overall status

## 2013-04-19 NOTE — Assessment & Plan Note (Signed)
Chronic Will not recheck since we are not considering transfusion or other testing

## 2013-04-19 NOTE — Assessment & Plan Note (Signed)
Mostly bradykinesia Will continue the sinemet

## 2013-04-19 NOTE — Assessment & Plan Note (Signed)
And sore throat Has defied diagnosis Will try tramadol for pain

## 2013-04-19 NOTE — Assessment & Plan Note (Signed)
Seems to be quiet now Just flat

## 2013-04-19 NOTE — Assessment & Plan Note (Signed)
Progressing from moderate to more severe Increasing care needs Incontinent More trouble with ambulating---though still walks slowly with rolling walker

## 2013-04-19 NOTE — Progress Notes (Signed)
Subjective:    Patient ID: Michele Mendoza, female    DOB: 1929/01/24, 80 y.o.   MRN: [id number]  HPI Daughter Lelan Pons is here Abby the hospice nurse is here  Had declining status Syncopal spells possibly related to arrhythmia Unresponsive with perioral and distal cyanosis May have come out of abnormal rhythm with staff attempts at stimulation (like vagal) Some spells of tachycardia---presumably from recurrent atrial fib (but may be regular at times per RN daughter) Seems to be more regular since back on metoprolol Does complain of chest pain at times---NTG in the past Breathing has been okay  Has decline in social engagement Now just spends time looking at cards Not eating well--takes forever to eat if not helped by staff  No apparent mood problems Slight irritability Mostly flat though Resists people touching her  Current Outpatient Prescriptions on File Prior to Visit  Medication Sig Dispense Refill  . ALPRAZolam (XANAX) 0.25 MG tablet Take 1 tablet (0.25 mg total) by mouth 3 (three) times daily as needed.  30 tablet  0  . antiseptic oral rinse (BIOTENE) LIQD 15 mLs by Mouth Rinse route 3 (three) times daily.      . Artificial Tear (GENTEAL) GEL Apply 1 drop to eye 3 (three) times daily.  10 mL  11  . aspirin 81 MG tablet Take 81 mg by mouth daily.      . carbidopa-levodopa (SINEMET IR) 10-100 MG per tablet Take 1 tablet by mouth 2 (two) times daily.  60 tablet  11  . docusate (COLACE) 50 MG/5ML liquid Give 20 ml/200 by mouth daily.      Marland Kitchen FeFum-FePo-FA-B Cmp-C-Zn-Mn-Cu (TANDEM PLUS PO) Take by mouth daily.      . folic acid (FOLVITE) 1 MG tablet Take 1 mg by mouth daily.      Marland Kitchen ibuprofen (ADVIL,MOTRIN) 400 MG tablet Take 400 mg by mouth every 8 (eight) hours as needed. Neck pain      . metoprolol succinate (TOPROL-XL) 25 MG 24 hr tablet Take 1 tablet (25 mg total) by mouth daily.  30 tablet  11  . sodium fluoride (PREVIDENT) 1.1 % GEL dental gel Place 1 application onto teeth  at bedtime.      . vitamin D, CHOLECALCIFEROL, 400 UNITS tablet Take 2 tablets (800 Units total) by mouth daily.  60 tablet  11   No current facility-administered medications on file prior to visit.    Allergies  Allergen Reactions  . Ciprofloxacin Hcl     REACTION: ash  . Codeine Sulfate     REACTION: hives  . Divalproex Sodium     REACTION: anemia  . Epinephrine Hcl     REACTION: needs plain lidocaine if local needed  . Penicillins     REACTION: rash and hives  . Prednisone     REACTION: triggers her bipolar  . Sulfamethoxazole-Trimethoprim     REACTION: hives    Past Medical History  Diagnosis Date  . Anemia   . Alzheimer's disease   . Bipolar affective disorder, manic   . Neuropathy   . History of recurrent UTIs   . Carcinomas, basal cell   . Arthritis   . Atrial fibrillation 10/13    Brief paroxysm  . GERD (gastroesophageal reflux disease)     Past Surgical History  Procedure Laterality Date  . Right leg plate from fracture  1970s  . Cholecystectomy    . Anemia  8/09    transfused and procrit----Dr Gittin  . Mania  hospitalized twice  . Lumbar laminectomy      Family History  Problem Relation Age of Onset  . Heart disease Daughter     History   Social History  . Marital Status: Widowed    Spouse Name: N/A    Number of Children: 4  . Years of Education: N/A   Occupational History  . 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 Smoking status: Never Smoker   . Smokeless tobacco: Never Used  . Alcohol Use: No  . Drug Use: No  . Sexual Activity: Not on file   Other Topics Concern  . Not on file   Social History Narrative   3 daughters    1 son   Daughter Leilani Able or Tye [place] have health care POA   Has living will   DNR in place   Review of Systems Now incontinent of bowel and bladder. She occasionally says she has to go---usually too late Some constipation---occasional vague abdominal pain Weight is  up---not sure if related to new scale Chronic anemia --still on tandem Still complains of throat pain--despite staff efforts to keep her hydrated    Objective:   Physical Exam  Constitutional: She appears well-developed. No distress.  Neck: Normal range of motion. No thyromegaly present.  Cardiovascular: Normal rate, regular rhythm and normal heart sounds.  Exam reveals no gallop.   No murmur heard. Pulmonary/Chest: Effort normal and breath sounds normal. No respiratory distress. She has no wheezes. She has no rales.  Abdominal: Soft. There is no tenderness.  Musculoskeletal: She exhibits no edema.  Lymphadenopathy:    She has no cervical adenopathy.  Neurological:  No focal weakness or increase in tone  Psychiatric:  Flat Mood is neutral though          Assessment & Plan:

## 2013-04-20 ENCOUNTER — Encounter: Payer: Self-pay | Admitting: Internal Medicine

## 2013-04-25 ENCOUNTER — Other Ambulatory Visit: Payer: Self-pay | Admitting: *Deleted

## 2013-04-26 DIAGNOSIS — I499 Cardiac arrhythmia, unspecified: Secondary | ICD-10-CM

## 2013-04-26 DIAGNOSIS — F0280 Dementia in other diseases classified elsewhere without behavioral disturbance: Secondary | ICD-10-CM

## 2013-04-26 DIAGNOSIS — G309 Alzheimer's disease, unspecified: Secondary | ICD-10-CM

## 2013-04-26 DIAGNOSIS — G20 Parkinson's disease: Secondary | ICD-10-CM

## 2013-04-26 MED ORDER — TRAMADOL HCL 50 MG PO TABS: 25.0000 mg | ORAL_TABLET | Freq: Three times a day (TID) | ORAL | Status: DC | PRN

## 2013-04-26 NOTE — Telephone Encounter (Signed)
Okay #45 x 5 In assisted living

## 2013-04-26 NOTE — Telephone Encounter (Signed)
rx called into pharmacy

## 2013-05-11 ENCOUNTER — Telehealth: Payer: Self-pay | Admitting: Family Medicine

## 2013-05-11 NOTE — Telephone Encounter (Signed)
Physician's interim orders faxed to our office by Hospice.  Requires Dr. Alla German signature and faxed back to them.  Given to [facility], [place].

## 2013-05-11 NOTE — Telephone Encounter (Signed)
Orders signed.

## 2013-05-11 NOTE — Telephone Encounter (Signed)
Form on your desk  

## 2013-07-29 ENCOUNTER — Emergency Department: Payer: Self-pay | Admitting: Emergency Medicine

## 2013-07-30 ENCOUNTER — Telehealth: Payer: Self-pay | Admitting: Internal Medicine

## 2013-07-30 NOTE — Telephone Encounter (Signed)
Pt's daughter called: Pt took a bad fall and has staples in the back of her head.  An order was faxed over from [facility] for PT.  She needs PT to strengthen her hips and legs so she can get out of her wheelchair.  She also needs an order to get her staples removed. Please call pt's daughter: Greg Cutter  [phone]

## 2013-07-30 NOTE — Telephone Encounter (Signed)
Order written to remove staples after 7 days and for PT eval

## 2013-07-31 NOTE — Telephone Encounter (Signed)
Order faxed and scanned.

## 2013-08-14 DIAGNOSIS — F0280 Dementia in other diseases classified elsewhere without behavioral disturbance: Secondary | ICD-10-CM

## 2013-08-14 DIAGNOSIS — F02818 Dementia in other diseases classified elsewhere, unspecified severity, with other behavioral disturbance: Secondary | ICD-10-CM

## 2013-08-14 DIAGNOSIS — G20 Parkinson's disease: Secondary | ICD-10-CM

## 2013-08-14 DIAGNOSIS — G309 Alzheimer's disease, unspecified: Secondary | ICD-10-CM

## 2013-08-14 DIAGNOSIS — F0281 Dementia in other diseases classified elsewhere with behavioral disturbance: Secondary | ICD-10-CM

## 2013-08-14 DIAGNOSIS — F319 Bipolar disorder, unspecified: Secondary | ICD-10-CM

## 2013-09-06 ENCOUNTER — Encounter: Payer: Self-pay | Admitting: Internal Medicine

## 2013-09-06 ENCOUNTER — Non-Acute Institutional Stay: Payer: Medicare Other | Admitting: Internal Medicine

## 2013-09-06 VITALS — BP 138/64 | HR 62 | Temp 97.2°F | Resp 20 | Wt 150.0 lb

## 2013-09-06 DIAGNOSIS — I480 Paroxysmal atrial fibrillation: Secondary | ICD-10-CM

## 2013-09-06 DIAGNOSIS — F0280 Dementia in other diseases classified elsewhere without behavioral disturbance: Secondary | ICD-10-CM

## 2013-09-06 DIAGNOSIS — L989 Disorder of the skin and subcutaneous tissue, unspecified: Secondary | ICD-10-CM | POA: Insufficient documentation

## 2013-09-06 DIAGNOSIS — F319 Bipolar disorder, unspecified: Secondary | ICD-10-CM

## 2013-09-06 DIAGNOSIS — G309 Alzheimer's disease, unspecified: Principal | ICD-10-CM

## 2013-09-06 DIAGNOSIS — R296 Repeated falls: Secondary | ICD-10-CM | POA: Insufficient documentation

## 2013-09-06 DIAGNOSIS — G20 Parkinson's disease: Secondary | ICD-10-CM

## 2013-09-06 DIAGNOSIS — Z9181 History of falling: Secondary | ICD-10-CM

## 2013-09-06 DIAGNOSIS — I4891 Unspecified atrial fibrillation: Secondary | ICD-10-CM

## 2013-09-06 NOTE — Assessment & Plan Note (Signed)
Still regular on metoprolol Presumed paroxysmal atrial fib but not concerned ASA only given frequent falls and recent hit to head

## 2013-09-06 NOTE — Progress Notes (Signed)
Subjective:    Patient ID: Michele Mendoza, female    DOB: [birth year], 80 y.o.   MRN: [id number]  HPI Seen with Debbie--unit coordinator Daughter out of town  Doing okay No apparent syncopal spells Has had a couple of falls though--including one that required ED visit and head staples HH RN saw, staples out. Brief PT eval--- ongoing safety awareness issues and ?some leg weakness  Help with bathing, dressing and bathroom. Will sometimes go on her own--but then that is how she fell once Still is incontinent  No agitation or mania Not depressed---seems satisfied in general  No chest pain No palpitations No SOB No sig edema  Current Outpatient Prescriptions on File Prior to Visit  Medication Sig Dispense Refill  . acetaminophen (TYLENOL) 325 MG tablet Take 650 mg by mouth 2 (two) times daily. And BID prn      . ALPRAZolam (XANAX) 0.25 MG tablet Take 1 tablet (0.25 mg total) by mouth 3 (three) times daily as needed.  30 tablet  0  . antiseptic oral rinse (BIOTENE) LIQD 15 mLs by Mouth Rinse route 3 (three) times daily.      . Artificial Tear (GENTEAL) GEL Apply 1 drop to eye 3 (three) times daily.  10 mL  11  . aspirin 81 MG tablet Take 81 mg by mouth daily.      . carbidopa-levodopa (SINEMET IR) 10-100 MG per tablet Take 1 tablet by mouth 2 (two) times daily.  60 tablet  11  . docusate (COLACE) 50 MG/5ML liquid Give 20 ml/200 by mouth daily.      Marland Kitchen FeFum-FePo-FA-B Cmp-C-Zn-Mn-Cu (TANDEM PLUS PO) Take by mouth daily.      . folic acid (FOLVITE) 1 MG tablet Take 1 mg by mouth daily.      Marland Kitchen ibuprofen (ADVIL,MOTRIN) 400 MG tablet Take 400 mg by mouth every 8 (eight) hours as needed. Neck pain      . metoprolol succinate (TOPROL-XL) 25 MG 24 hr tablet Take 1 tablet (25 mg total) by mouth daily.  30 tablet  11  . sodium fluoride (PREVIDENT) 1.1 % GEL dental gel Place 1 application onto teeth at bedtime.      . traMADol (ULTRAM) 50 MG tablet Take 0.5 tablets (25 mg total) by mouth 3  (three) times daily as needed.  45 tablet  5  . vitamin D, CHOLECALCIFEROL, 400 UNITS tablet Take 2 tablets (800 Units total) by mouth daily.  60 tablet  11   No current facility-administered medications on file prior to visit.    Allergies  Allergen Reactions  . Ciprofloxacin Hcl     REACTION: ash  . Codeine Sulfate     REACTION: hives  . Divalproex Sodium     REACTION: anemia  . Epinephrine Hcl     REACTION: needs plain lidocaine if local needed  . Penicillins     REACTION: rash and hives  . Prednisone     REACTION: triggers her bipolar  . Sulfamethoxazole-Trimethoprim     REACTION: hives    Past Medical History  Diagnosis Date  . Anemia   . Alzheimer's disease   . Bipolar affective disorder, manic   . Neuropathy   . History of recurrent UTIs   . Carcinomas, basal cell   . Arthritis   . Atrial fibrillation 10/13    Brief paroxysm  . GERD (gastroesophageal reflux disease)     Past Surgical History  Procedure Laterality Date  . Right leg plate from fracture  1970s  . Cholecystectomy    . Anemia  8/09    transfused and procrit----Dr Gittin  . Mania      hospitalized twice  . Lumbar laminectomy      Family History  Problem Relation Age of Onset  . Heart disease Daughter     History   Social History  . Marital Status: Widowed    Spouse Name: N/A    Number of Children: 4  . Years of Education: N/A   Occupational History  . 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 Smoking status: Never Smoker   . Smokeless tobacco: Never Used  . Alcohol Use: No  . Drug Use: No  . Sexual Activity: Not on file   Other Topics Concern  . Not on file   Social History Narrative   3 daughters    1 son   Daughter [name]--RN or Tye [place] have health care POA   Has living will   DNR in place   Review of Systems Appetite is okay Weight stable Sleeps fairly well Bowels fine New lesion on top of right ear--just noted in past week      Objective:   Physical Exam  Constitutional: She appears well-developed and well-nourished. No distress.  Neck: Normal range of motion. Neck supple.  Cardiovascular: Normal rate, regular rhythm and normal heart sounds.  Exam reveals no gallop.   No murmur heard. Pulmonary/Chest: Effort normal and breath sounds normal. No respiratory distress. She has no wheezes. She has no rales.  Abdominal: Soft. There is no tenderness.  Musculoskeletal: She exhibits no edema.  Lymphadenopathy:    She has no cervical adenopathy.  Skin:  White scab like area (73mm) on top of right pinna. No inflammation  Psychiatric:  Engages when spoken to Brief answers Seems satisfied---neutral mood Slightly flattened affect (?Parkinsonism)          Assessment & Plan:

## 2013-09-06 NOTE — Assessment & Plan Note (Signed)
Doesn't seem to be active even off all meds

## 2013-09-06 NOTE — Assessment & Plan Note (Signed)
Trouble independently getting out of chair Will ask PT to reevaluate and see if they can work on strength

## 2013-09-06 NOTE — Assessment & Plan Note (Signed)
May be influencing her safety walking PT eval

## 2013-09-06 NOTE — Assessment & Plan Note (Signed)
On right ear Likely actinic but too soon to be sure If no improvement in 2 weeks, will arrange for liquid nitrogen Rx

## 2013-09-06 NOTE — Assessment & Plan Note (Signed)
Moderate with sig care needs Still okay in the Memory [facility] unit Fairly stable

## 2014-01-30 ENCOUNTER — Telehealth: Payer: Self-pay | Admitting: Internal Medicine

## 2014-01-30 NOTE — Telephone Encounter (Signed)
Pt daughter Lelan Pons, called requesting referral to Hospice. She thinks her mother is actively dying. If you need to call Lelan Pons, please call cell #. She is on her way to [facility] now.

## 2014-01-30 NOTE — Telephone Encounter (Signed)
Discussed with her and staff at [facility] referral put in Will see her next Thursday

## 2014-02-04 ENCOUNTER — Telehealth: Payer: Self-pay

## 2014-02-04 NOTE — Telephone Encounter (Signed)
PLEASE NOTE: All timestamps contained within this report are represented as [place] Standard Time. CONFIDENTIALTY NOTICE: This fax transmission is intended only for the addressee. It contains information that is legally privileged, confidential or otherwise protected from use or disclosure. If you are not the intended recipient, you are strictly prohibited from reviewing, disclosing, copying using or disseminating any of this information or taking any action in reliance on or regarding this information. If you have received this fax in error, please notify us immediately by telephone so that we can arrange for its return to [place]. Phone: [phone], Toll-Free: [phone], Fax: [phone] Page: 1 of 2 Call Id: [id number] [place] Patient Name: Michele Mendoza Gender: Female DOB: 1928-07-02 Age: 81 Y 11 M 2 D Return Phone Number: Address: [address] , [place] City/State/Zip:  Client [place] Night - Client Client Site [place] Physician Viviana Simpler Contact Type Call Call Type Page Only Caller Name Drue Dun [phone] hospice nurse Relationship To Patient Provider Is this call to report lab results? No Return Phone Number Unavailable Initial Comment ( 2nd Attempt,1st Attempt ) Caller states, Drue Dun [phone] hospice nurse of [place], patient is at a facility, she has an order for Tylenol tablets, she is unable to take these, she needs an order for suppositories , the facility requires the order to be faxed to them -- the nurse asked the Dr call her back to get details Nurse Assessment Guidelines Guideline Title Affirmed Question Affirmed Notes Nurse Date/Time ([place] Time) Disp. Time Eilene Ghazi Time) Disposition Final User 02/02/2014 2:43:14 PM Send to [place] 02/02/2014 2:48:11 PM Paged On Call  back to Call [place], [place] 02/02/2014 3:05:42 PM Paged On Call back to Call [place], [facility] 02/02/2014 3:05:48 PM Paged On Call back to Call [place], [place] 02/02/2014 3:19:41 PM Page Completed Doug Sou, [facility] 02/02/2014 3:19:49 PM Page Completed Yes Doug Sou, [facility] Instructions Given Call Event Type User Date / Time Description Paging DoctorName DoctorPhone DateTime Result/Outcome Notes Crissie Sickles [phone] 02/02/2014 2:48:11 PM Called On Call Provider - Left Message Crissie Sickles [phone] 02/02/2014 3:05:42 PM Called On Call Provider - Left Message ( 2 nd Attempt ) Left voice mail Crissie Sickles 02/02/2014 3:19:34 PM Spoke with On Call - General Provided on-call with page information, provided call back PLEASE NOTE: All timestamps contained within this report are represented as [place] Standard Time. CONFIDENTIALTY NOTICE: This fax transmission is intended only for the addressee. It contains information that is legally privileged, confidential or otherwise protected from use or disclosure. If you are not the intended recipient, you are strictly prohibited from reviewing, disclosing, copying using or disseminating any of this information or taking any action in reliance on or regarding this information. If you have received this fax in error, please notify us immediately by telephone so that we can arrange for its return to [place]. Phone: [phone], Toll-Free: [phone], Fax: [phone] Page: 2 of 2 Call Id: [id number] [place] DateTime Result/Outcome Notes phone number. On-call states will call the caller back.

## 2014-02-04 NOTE — Telephone Encounter (Signed)
Spoke with hospice nurse today Not doing well Will see on Thursday but she is not sure she will still be alive Daughter aware and comfortable with situation

## 2014-02-05 ENCOUNTER — Telehealth: Payer: Self-pay | Admitting: Internal Medicine

## 2014-02-05 MED ORDER — NITROFURANTOIN MONOHYD MACRO 100 MG PO CAPS: 100.0000 mg | ORAL_CAPSULE | Freq: Two times a day (BID) | ORAL | Status: AC

## 2014-02-05 NOTE — Telephone Encounter (Signed)
Phone call from [place] with fever to 102 Specific urinary symptoms  Will send urine Try macrodantin empirically---she has multiple allergies  Still has visit on Thursday

## 2014-02-07 ENCOUNTER — Encounter: Payer: Self-pay | Admitting: Internal Medicine

## 2014-02-07 ENCOUNTER — Non-Acute Institutional Stay: Admitting: Internal Medicine

## 2014-02-07 VITALS — BP 130/56 | HR 118 | Temp 99.7°F | Resp 30

## 2014-02-07 DIAGNOSIS — I639 Cerebral infarction, unspecified: Secondary | ICD-10-CM | POA: Diagnosis not present

## 2014-02-07 DIAGNOSIS — F0280 Dementia in other diseases classified elsewhere without behavioral disturbance: Secondary | ICD-10-CM

## 2014-02-07 DIAGNOSIS — G309 Alzheimer's disease, unspecified: Secondary | ICD-10-CM

## 2014-02-07 DIAGNOSIS — F3170 Bipolar disorder, currently in remission, most recent episode unspecified: Secondary | ICD-10-CM | POA: Diagnosis not present

## 2014-02-07 DIAGNOSIS — I480 Paroxysmal atrial fibrillation: Secondary | ICD-10-CM | POA: Diagnosis not present

## 2014-02-07 MED ORDER — METOPROLOL SUCCINATE ER 25 MG PO TB24: 12.5000 mg | ORAL_TABLET | Freq: Every day | ORAL | Status: AC

## 2014-02-07 NOTE — Assessment & Plan Note (Signed)
Seems clear cut that she had vascular event last week Will change to comfort care and transfer to hospice home

## 2014-02-07 NOTE — Assessment & Plan Note (Signed)
Jumping in and out Probably cause of acute change in status if she did have a stroke Was only on aspirin due to risks of bleeding with falls--- will continue if able to take meds

## 2014-02-07 NOTE — Assessment & Plan Note (Signed)
Not evident any more

## 2014-02-07 NOTE — Progress Notes (Signed)
Subjective:    Patient ID: Michele Mendoza, female    DOB: 12/21/1928, 81 y.o.   MRN: [id number]  HPI Daughter here for follow up on dementia Reviewed status with unit coordinator [place] here  Had striking decline last week Barely eating---doesn't really swallow well Hospice started Possible UTI---empiric macrodantin started  Will have rapid respirations at times Morphine helps Clearly aspirating--- despite chin tuck, etc Hasn't had lung findings on exam though Persistent fever--- ??pneumonitis with the aspiration  Has seemed to have reflux--then swallowing down  "Her mind is gone" per daughter Barely speaking Seems to be in distress regularly--hard to tell if she is in physical pain Will grab at throat and chest  Current Outpatient Prescriptions on File Prior to Visit  Medication Sig Dispense Refill  . acetaminophen (TYLENOL) 325 MG tablet Take 650 mg by mouth 2 (two) times daily. And BID prn    . Artificial Tear (GENTEAL) GEL Apply 1 drop to eye 3 (three) times daily. 10 mL 11  . aspirin 81 MG tablet Take 81 mg by mouth daily.    . carbidopa-levodopa (SINEMET IR) 10-100 MG per tablet Take 1 tablet by mouth 2 (two) times daily. 60 tablet 11  . docusate (COLACE) 50 MG/5ML liquid Give 20 ml/200 by mouth daily.    Marland Kitchen ibuprofen (ADVIL,MOTRIN) 400 MG tablet Take 400 mg by mouth every 8 (eight) hours as needed. Neck pain    . nitrofurantoin, macrocrystal-monohydrate, (MACROBID) 100 MG capsule Take 1 capsule (100 mg total) by mouth 2 (two) times daily. 20 capsule 0   No current facility-administered medications on file prior to visit.    Allergies  Allergen Reactions  . Ciprofloxacin Hcl     REACTION: ash  . Codeine Sulfate     REACTION: hives  . Divalproex Sodium     REACTION: anemia  . Epinephrine Hcl     REACTION: needs plain lidocaine if local needed  . Penicillins     REACTION: rash and hives  . Prednisone     REACTION: triggers her bipolar  .  Sulfamethoxazole-Trimethoprim     REACTION: hives    Past Medical History  Diagnosis Date  . Anemia   . Alzheimer's disease   . Bipolar affective disorder, manic   . Neuropathy   . History of recurrent UTIs   . Carcinomas, basal cell   . Arthritis   . Atrial fibrillation 10/13    Brief paroxysm  . GERD (gastroesophageal reflux disease)     Past Surgical History  Procedure Laterality Date  . Right leg plate from fracture  1970s  . Cholecystectomy    . Anemia  8/09    transfused and procrit----Dr Gittin  . Mania      hospitalized twice  . Lumbar laminectomy      Family History  Problem Relation Age of Onset  . Heart disease Daughter     History   Social History  . Marital Status: Widowed    Spouse Name: N/A    Number of Children: 4  . Years of Education: N/A   Occupational History  . 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 Smoking status: Never Smoker   . Smokeless tobacco: Never Used  . Alcohol Use: No  . Drug Use: No  . Sexual Activity: Not on file   Other Topics Concern  . Not on file   Social History Narrative   3  daughters    1 son   Daughter [name]--RN or [place] have health care POA   Has living will   DNR in place   Review of Systems Has been constipated Incontinent Has right heel injury--no no back or buttock decubiti    Objective:   Physical Exam  Constitutional:  Eyes closed--opens to voice but limited interaction RR now down to about 20  Neck: No thyromegaly present.  Cardiovascular: Exam reveals no gallop.   No murmur heard. Regular at about 94, then irregular at about 110   Pulmonary/Chest: Breath sounds normal. No respiratory distress. She has no wheezes. She has no rales.  Abdominal: Soft.  ?some generalized tenderness  Lymphadenopathy:    She has no cervical adenopathy.  Neurological:  No active movement in legs Limited use of arms---hard to tell if just related to sleepiness  Skin:    Large blister on right heel with some surrounding redness          Assessment & Plan:

## 2014-02-07 NOTE — Assessment & Plan Note (Signed)
Sudden rapid decline in status which is suspicious for stroke Now not able to really swallow General distress Discussed with daughter at length---we really want a comfort care focus Will try to get her transferred to hospice home as soon as possible

## 2014-02-12 ENCOUNTER — Encounter: Payer: Self-pay | Admitting: Internal Medicine

## 2014-02-13 ENCOUNTER — Telehealth: Payer: Self-pay | Admitting: Internal Medicine

## 2014-02-13 NOTE — Telephone Encounter (Signed)
Called her to express my condolences

## 2014-02-13 NOTE — Telephone Encounter (Signed)
Lelan Pons called to let you know patient passed away last night. Lelan Pons said she appreciated all you did for her mother.

## 2014-03-04 DEATH — deceased
